# Patient Record
Sex: Male | Born: 2000 | Race: White | Hispanic: No | Marital: Single | Smoking: Never smoker
Health system: Southern US, Community
[De-identification: ages and names within clinical notes are randomized; demographics above are authoritative.]

## PROBLEM LIST (undated history)

## (undated) DIAGNOSIS — F419 Anxiety disorder, unspecified: Secondary | ICD-10-CM

## (undated) HISTORY — PX: NO PAST SURGERIES: SHX2092

---
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
## 2022-01-20 ENCOUNTER — Other Ambulatory Visit: Payer: Self-pay

## 2022-01-20 ENCOUNTER — Emergency Department (HOSPITAL_COMMUNITY): Payer: Medicaid Other

## 2022-01-20 ENCOUNTER — Emergency Department (HOSPITAL_COMMUNITY)
Admission: EM | Admit: 2022-01-20 | Discharge: 2022-01-21 | Disposition: A | Discharge status: Home / Self Care | Payer: Medicaid Other | Attending: Emergency Medicine | Admitting: Emergency Medicine

## 2022-01-20 DIAGNOSIS — E876 Hypokalemia: Secondary | ICD-10-CM | POA: Diagnosis not present

## 2022-01-20 DIAGNOSIS — F1290 Cannabis use, unspecified, uncomplicated: Secondary | ICD-10-CM

## 2022-01-20 DIAGNOSIS — R61 Generalized hyperhidrosis: Secondary | ICD-10-CM | POA: Diagnosis not present

## 2022-01-20 DIAGNOSIS — S8992XA Unspecified injury of left lower leg, initial encounter: Secondary | ICD-10-CM | POA: Diagnosis present

## 2022-01-20 DIAGNOSIS — Y92410 Unspecified street and highway as the place of occurrence of the external cause: Secondary | ICD-10-CM | POA: Insufficient documentation

## 2022-01-20 DIAGNOSIS — Z23 Encounter for immunization: Secondary | ICD-10-CM | POA: Insufficient documentation

## 2022-01-20 DIAGNOSIS — R443 Hallucinations, unspecified: Secondary | ICD-10-CM | POA: Insufficient documentation

## 2022-01-20 DIAGNOSIS — R000 Tachycardia, unspecified: Secondary | ICD-10-CM | POA: Diagnosis not present

## 2022-01-20 DIAGNOSIS — S81012A Laceration without foreign body, left knee, initial encounter: Secondary | ICD-10-CM | POA: Insufficient documentation

## 2022-01-20 DIAGNOSIS — R451 Restlessness and agitation: Secondary | ICD-10-CM | POA: Insufficient documentation

## 2022-01-20 LAB — COMPREHENSIVE METABOLIC PANEL
ALT: 20 U/L (ref 0–44)
AST: 25 U/L (ref 15–41)
Albumin: 4.4 g/dL (ref 3.5–5.0)
Alkaline Phosphatase: 51 U/L (ref 38–126)
Anion gap: 13 (ref 5–15)
BUN: 18 mg/dL (ref 6–20)
CO2: 21 mmol/L — ABNORMAL LOW (ref 22–32)
Calcium: 9.2 mg/dL (ref 8.9–10.3)
Chloride: 105 mmol/L (ref 98–111)
Creatinine, Ser: 1.3 mg/dL — ABNORMAL HIGH (ref 0.61–1.24)
GFR, Estimated: >60 mL/min (ref >60)
Glucose, Bld: 202 mg/dL — ABNORMAL HIGH (ref 70–99)
Potassium: 2.8 mmol/L — ABNORMAL LOW (ref 3.5–5.1)
Sodium: 139 mmol/L (ref 135–145)
Total Bilirubin: 1.2 mg/dL (ref 0.3–1.2)
Total Protein: 7.1 g/dL (ref 6.5–8.1)

## 2022-01-20 LAB — CBC WITH DIFFERENTIAL/PLATELET
Abs Immature Granulocytes: 0.04 10*3/uL (ref 0.00–0.07)
Basophils Absolute: 0.1 10*3/uL (ref 0.0–0.1)
Basophils Relative: 1 %
Eosinophils Absolute: 0.2 10*3/uL (ref 0.0–0.5)
Eosinophils Relative: 3 %
HCT: 47.3 % (ref 39.0–52.0)
Hemoglobin: 15.6 g/dL (ref 13.0–17.0)
Immature Granulocytes: 0 %
Lymphocytes Relative: 36 %
Lymphs Abs: 3.3 10*3/uL (ref 0.7–4.0)
MCH: 27 pg (ref 26.0–34.0)
MCHC: 33 g/dL (ref 30.0–36.0)
MCV: 82 fL (ref 80.0–100.0)
Monocytes Absolute: 1.1 10*3/uL — ABNORMAL HIGH (ref 0.1–1.0)
Monocytes Relative: 12 %
Neutro Abs: 4.3 10*3/uL (ref 1.7–7.7)
Neutrophils Relative %: 48 %
Platelets: 234 10*3/uL (ref 150–400)
RBC: 5.77 MIL/uL (ref 4.22–5.81)
RDW: 12.9 % (ref 11.5–15.5)
WBC: 9 10*3/uL (ref 4.0–10.5)
nRBC: 0 % (ref 0.0–0.2)

## 2022-01-20 LAB — I-STAT CHEM 8, ED
BUN: 20 mg/dL (ref 6–20)
Calcium, Ion: 1.15 mmol/L (ref 1.15–1.40)
Chloride: 101 mmol/L (ref 98–111)
Creatinine, Ser: 1.1 mg/dL (ref 0.61–1.24)
Glucose, Bld: 205 mg/dL — ABNORMAL HIGH (ref 70–99)
HCT: 48 % (ref 39.0–52.0)
Hemoglobin: 16.3 g/dL (ref 13.0–17.0)
Potassium: 2.8 mmol/L — ABNORMAL LOW (ref 3.5–5.1)
Sodium: 141 mmol/L (ref 135–145)
TCO2: 23 mmol/L (ref 22–32)

## 2022-01-20 MED ORDER — LACTATED RINGERS IV SOLN: INTRAVENOUS | Status: DC

## 2022-01-20 MED ORDER — TETANUS-DIPHTH-ACELL PERTUSSIS 5-2.5-18.5 LF-MCG/0.5 IM SUSY
0.5000 mL | PREFILLED_SYRINGE | Freq: Once | INTRAMUSCULAR | Status: AC
  Administered 2022-01-20: 0.5 mL via INTRAMUSCULAR
  Filled 2022-01-20: qty 0.5

## 2022-01-20 MED ORDER — LIDOCAINE-EPINEPHRINE (PF) 2 %-1:200000 IJ SOLN
INTRAMUSCULAR | Status: AC
  Filled 2022-01-20: qty 20

## 2022-01-20 MED ORDER — POTASSIUM CHLORIDE 10 MEQ/100ML IV SOLN
10.0000 meq | INTRAVENOUS | Status: AC
  Administered 2022-01-21 (×2): 10 meq via INTRAVENOUS
  Filled 2022-01-20 (×2): qty 100

## 2022-01-20 MED ORDER — LORAZEPAM 2 MG/ML IJ SOLN
1.0000 mg | Freq: Once | INTRAMUSCULAR | Status: AC
  Administered 2022-01-20: 1 mg via INTRAVENOUS
  Filled 2022-01-20: qty 1
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
## 2022-01-21 MED ORDER — KETOROLAC TROMETHAMINE 15 MG/ML IJ SOLN
15.0000 mg | Freq: Once | INTRAMUSCULAR | Status: AC
  Administered 2022-01-21: 15 mg via INTRAVENOUS
  Filled 2022-01-21: qty 1

## 2022-01-21 NOTE — ED Notes (Signed)
Pt provided water.  
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
## 2022-01-21 NOTE — Progress Notes (Signed)
   01/20/22 2125  Clinical Encounter Type  Visited With Patient not available;Health care provider  Visit Type ED;Trauma;Initial  Referral From Nurse  Consult/Referral To Chaplain Melvenia Beam)  Recommendations Level 2 Trauma   Responded to Emergency Department; Room 25 for Level 2 Trauma. Patient currently being evaluated and treated by medical staff, therefore patient not seen by Chaplain at this time. No family present at this time.  Chaplain will be available for consultation upon request of patient, family, or staff.  Chaplain Johnpaul Gillentine, M.Min., [phone].

## 2022-01-21 NOTE — Discharge Instructions (Addendum)
Do not let your laceration (cut) get wet for the next 48 hours. After that you may allow soapy water to drain down the wound to clean it. Please do not scrub.  To minimize scarring, you can apply a vaseline based ointment for the next 2 weeks and keep it out of direct sun light. After that, you may apply sunscreen for the next several months. Your staples will need to be removed in 10-14 days.  Return if your wound appears to be infected (see laceration care instructions).

## 2022-01-21 NOTE — ED Notes (Signed)
Wound care performed, dressing applied to left knee. Pt still very lethargic. Attempted to stand but unable to solidly hold his own weight, shaking noted. Pt sat on edge of bed, unsteady and RN assisted pt with use of urinal. Pt assisted back into bed.

## 2022-01-21 NOTE — Progress Notes (Signed)
Orthopedic Tech Progress Note Patient Details:  Benjamin Werner 12/21/2000 [id number]  Patient ID: [name], male   DOB: May 15, 2000, 21 y.o.   MRN: [id number] I attended trauma page. Karolee Stamps 01/21/2022, 12:37 AM

## 2022-01-21 NOTE — ED Provider Notes (Signed)
Laceration repaired by me as below, please see Dr. Maryan Rued & Dr. Wilma Flavin notes for further details of patient care.   Procedures  .Marland KitchenLaceration Repair  Date/Time: 01/21/2022 1:13 AM  Performed by: Amaryllis Dyke, PA-C Authorized by: Amaryllis Dyke, PA-C   Consent:    Consent obtained:  Verbal   Consent given by:  Patient and parent   Risks, benefits, and alternatives were discussed: yes     Risks discussed:  Need for additional repair, nerve damage, infection, pain, poor cosmetic result, poor wound healing, vascular damage, tendon damage and retained foreign body   Alternatives discussed:  No treatment Anesthesia:    Anesthesia method:  Local infiltration   Local anesthetic:  Lidocaine 2% WITH epi Laceration details:    Location:  Leg   Leg location:  L knee   Length (cm):  3   Depth (mm):  5 Exploration:    Hemostasis achieved with:  Direct pressure   Contaminated: fabric strands present- manually removed, cleansed w/ betadine & irrigated w/ sterile water.   Treatment:    Area cleansed with:  Povidone-iodine   Amount of cleaning:  Standard   Irrigation solution:  Sterile water   Irrigation method:  Pressure wash   Visualized foreign bodies/material removed: yes   Skin repair:    Repair method:  Staples   Number of staples:  3 Approximation:    Approximation:  Close Repair type:    Repair type:  Simple Post-procedure details:    Procedure completion:  Tolerated well, no immediate complications         Amaryllis Dyke, PA-C 01/21/22 0115    Fatima Blank, MD 01/21/22 [phone]

## 2022-01-27 ENCOUNTER — Emergency Department (HOSPITAL_COMMUNITY): Payer: Medicaid Other

## 2022-01-27 ENCOUNTER — Emergency Department (HOSPITAL_COMMUNITY)
Admission: EM | Admit: 2022-01-27 | Discharge: 2022-01-28 | Disposition: A | Discharge status: Other Acute Inpatient Hospital | Payer: Medicaid Other | Attending: Emergency Medicine | Admitting: Emergency Medicine

## 2022-01-27 ENCOUNTER — Other Ambulatory Visit: Payer: Self-pay

## 2022-01-27 ENCOUNTER — Ambulatory Visit (HOSPITAL_COMMUNITY)
Admission: RE | Admit: 2022-01-27 | Discharge: 2022-01-27 | Disposition: A | Discharge status: Home / Self Care | Payer: Medicaid Other | Attending: Psychiatry | Admitting: Psychiatry

## 2022-01-27 DIAGNOSIS — Y900 Blood alcohol level of less than 20 mg/100 ml: Secondary | ICD-10-CM | POA: Insufficient documentation

## 2022-01-27 DIAGNOSIS — Z20822 Contact with and (suspected) exposure to covid-19: Secondary | ICD-10-CM | POA: Diagnosis not present

## 2022-01-27 DIAGNOSIS — F19959 Other psychoactive substance use, unspecified with psychoactive substance-induced psychotic disorder, unspecified: Secondary | ICD-10-CM

## 2022-01-27 DIAGNOSIS — Z79899 Other long term (current) drug therapy: Secondary | ICD-10-CM | POA: Insufficient documentation

## 2022-01-27 DIAGNOSIS — Z008 Encounter for other general examination: Secondary | ICD-10-CM

## 2022-01-27 DIAGNOSIS — Z046 Encounter for general psychiatric examination, requested by authority: Secondary | ICD-10-CM | POA: Diagnosis present

## 2022-01-27 LAB — RAPID URINE DRUG SCREEN, HOSP PERFORMED
Amphetamines: NOT DETECTED
Barbiturates: NOT DETECTED
Benzodiazepines: NOT DETECTED
Cocaine: NOT DETECTED
Opiates: NOT DETECTED
Tetrahydrocannabinol: NOT DETECTED

## 2022-01-27 LAB — CBC WITH DIFFERENTIAL/PLATELET
Abs Immature Granulocytes: 0.05 10*3/uL (ref 0.00–0.07)
Basophils Absolute: 0 10*3/uL (ref 0.0–0.1)
Basophils Relative: 0 %
Eosinophils Absolute: 0 10*3/uL (ref 0.0–0.5)
Eosinophils Relative: 0 %
HCT: 44.2 % (ref 39.0–52.0)
Hemoglobin: 15 g/dL (ref 13.0–17.0)
Immature Granulocytes: 1 %
Lymphocytes Relative: 9 %
Lymphs Abs: 1 10*3/uL (ref 0.7–4.0)
MCH: 27.4 pg (ref 26.0–34.0)
MCHC: 33.9 g/dL (ref 30.0–36.0)
MCV: 80.7 fL (ref 80.0–100.0)
Monocytes Absolute: 1.5 10*3/uL — ABNORMAL HIGH (ref 0.1–1.0)
Monocytes Relative: 14 %
Neutro Abs: 8.1 10*3/uL — ABNORMAL HIGH (ref 1.7–7.7)
Neutrophils Relative %: 76 %
Platelets: 187 10*3/uL (ref 150–400)
RBC: 5.48 MIL/uL (ref 4.22–5.81)
RDW: 12.9 % (ref 11.5–15.5)
WBC: 10.7 10*3/uL — ABNORMAL HIGH (ref 4.0–10.5)
nRBC: 0 % (ref 0.0–0.2)

## 2022-01-27 LAB — COMPREHENSIVE METABOLIC PANEL
ALT: 16 U/L (ref 0–44)
AST: 22 U/L (ref 15–41)
Albumin: 4.3 g/dL (ref 3.5–5.0)
Alkaline Phosphatase: 48 U/L (ref 38–126)
Anion gap: 8 (ref 5–15)
BUN: 14 mg/dL (ref 6–20)
CO2: 24 mmol/L (ref 22–32)
Calcium: 9.3 mg/dL (ref 8.9–10.3)
Chloride: 104 mmol/L (ref 98–111)
Creatinine, Ser: 1.23 mg/dL (ref 0.61–1.24)
GFR, Estimated: >60 mL/min (ref >60)
Glucose, Bld: 138 mg/dL — ABNORMAL HIGH (ref 70–99)
Potassium: 3.9 mmol/L (ref 3.5–5.1)
Sodium: 136 mmol/L (ref 135–145)
Total Bilirubin: 1.6 mg/dL — ABNORMAL HIGH (ref 0.3–1.2)
Total Protein: 7.3 g/dL (ref 6.5–8.1)

## 2022-01-27 LAB — RESP PANEL BY RT-PCR (FLU A&B, COVID) ARPGX2
Influenza A by PCR: NEGATIVE
Influenza B by PCR: NEGATIVE
SARS Coronavirus 2 by RT PCR: NEGATIVE

## 2022-01-27 LAB — TSH: TSH: 0.498 u[IU]/mL (ref 0.350–4.500)

## 2022-01-27 LAB — ETHANOL: Alcohol, Ethyl (B): <10 mg/dL (ref <10)

## 2022-01-27 MED ORDER — HYDROXYZINE HCL 25 MG PO TABS
25.0000 mg | ORAL_TABLET | Freq: Once | ORAL | Status: AC
  Administered 2022-01-27: 25 mg via ORAL
  Filled 2022-01-27: qty 1

## 2022-01-27 MED ORDER — ZOLPIDEM TARTRATE 5 MG PO TABS
5.0000 mg | ORAL_TABLET | Freq: Every evening | ORAL | Status: DC | PRN
  Administered 2022-01-27 – 2022-01-28 (×2): 5 mg via ORAL
  Filled 2022-01-27 (×2): qty 1

## 2022-01-27 NOTE — ED Provider Notes (Signed)
Care transferred from [facility], PA-C at time of sign out. See their note for full assessment.   Briefly: Patient is 21 y.o. male who presents to the ED with concerns for worsening confusion. Sent by [facility] for medical evaluation.    Plan: Plan per previous PA-C: pending TTS.  Labs Reviewed  COMPREHENSIVE METABOLIC PANEL - Abnormal; Notable for the following components:      Result Value   Glucose, Bld 138 (*)    Total Bilirubin 1.6 (*)    All other components within normal limits  CBC WITH DIFFERENTIAL/PLATELET - Abnormal; Notable for the following components:   WBC 10.7 (*)    Neutro Abs 8.1 (*)    Monocytes Absolute 1.5 (*)    All other components within normal limits  RESP PANEL BY RT-PCR (FLU A&B, COVID) ARPGX2  ETHANOL  RAPID URINE DRUG SCREEN, HOSP PERFORMED  TSH    Clinical Course as of 01/27/22 2128  Sat Jan 27, 2022  2002 Discussed with patient and family that patient will stay the night for TTS consult. Mother notes that patient doesn't take any daily medications.  [SB]    Clinical Course User Index [SB] Yeiren Whitecotton A, PA-C     Doubt intracranial abnormality, electrolyte abnormality, illicit drugs, ETOH, at this time. Pt to follow up with TTS and dispo as per TTS consult. Discussed this with patient, family at bedside.  Answered all available questions.  Patient and family agreeable at this time for TTS consult.  This chart was dictated using voice recognition software, Dragon. Despite the best efforts of this provider to proofread and correct errors, errors may still occur which can change documentation meaning.   Etoile Looman A, PA-C 01/27/22 2132    Regan Lemming, MD 01/27/22 2255

## 2022-01-27 NOTE — ED Notes (Signed)
Abbott Pao (Mom) [phone] or [phone]  Bonney Aid (sister) [phone]

## 2022-01-27 NOTE — ED Triage Notes (Signed)
Pt arrives with mom, sent from [facility] for medical clearance. Pt has reportedly been having episodes of confusion, running out of the house, saying things that don't make sense. Pt denies SI/HI.

## 2022-01-27 NOTE — ED Notes (Signed)
Patient transported to CT 
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
## 2022-01-27 NOTE — ED Provider Notes (Signed)
[facility] DEPT Provider Note   CSN: [id number] Arrival date & time: 01/27/22  1524     History  No chief complaint on file.   Benjamin Werner is a 21 y.o. male   Patient presents to the emergency department from behavioral health hospital.  He had gone there this morning voluntarily for worsening confusion due to marijuana ingestion on 10/21.  No significant psychiatric diagnoses.  Was sent to the ER for medical clearance by behavioral health staff as they were concerned for psychoactive substance induced psychosis.  He has not been cleared psychiatrically.  Per mother and history of bedside, they state the patient has not been acting himself since that episode on 10/21.  They state that he is very paranoid and incredibly overprotective of them.  Mother went to the bathroom the other day, and was only in there for about 30 seconds, before the patient broke the door down because he was worried that something happened to her.  He denies suicidal ideation, but mother states that he had made comments earlier in the week about wanting to harm himself with a gun.  Patient denies HI, AVH.  HPI     Home Medications Prior to Admission medications   Medication Sig Start Date End Date Taking? Authorizing Provider  cefUROXime (CEFTIN) 500 MG tablet Take 1 tablet (500 mg total) by mouth 2 (two) times daily with a meal. Patient not taking: Reported on 07/31/2019 06/13/16   Little, Wenda Overland, MD  diclofenac (VOLTAREN) 50 MG EC tablet Take 1 tablet (50 mg total) by mouth 2 (two) times daily. Patient not taking: Reported on 07/31/2019 02/28/16   Robyn Haber, MD  HYDROcodone-acetaminophen (NORCO/VICODIN) 5-325 MG tablet Take 1 tablet by mouth every 6 (six) hours as needed for severe pain. Patient not taking: Reported on 07/31/2019 06/21/16   Jean Rosenthal, NP  ibuprofen (ADVIL,MOTRIN) 800 MG tablet Take 1 tablet (800 mg total) by mouth every 8 (eight) hours as  needed for mild pain or moderate pain. Patient not taking: Reported on 07/31/2019 06/21/16   Jean Rosenthal, NP      Allergies    Penicillins    Review of Systems   Review of Systems  Psychiatric/Behavioral:  Positive for agitation, behavioral problems and sleep disturbance. Negative for self-injury.   All other systems reviewed and are negative.   Physical Exam Updated Vital Signs BP 128/67   Pulse (!) 105   Temp 99.8 F (37.7 C) (Oral)   Resp 16   SpO2 98%  Physical Exam Vitals and nursing note reviewed.  Constitutional:      Appearance: Normal appearance.  HENT:     Head: Normocephalic and atraumatic.  Eyes:     Conjunctiva/sclera: Conjunctivae normal.  Pulmonary:     Effort: Pulmonary effort is normal. No respiratory distress.  Skin:    General: Skin is warm and dry.     Comments: Linear laceration over left knee with 3 staples, well healing  Neurological:     Mental Status: He is alert.  Psychiatric:        Attention and Perception: He does not perceive auditory or visual hallucinations.        Mood and Affect: Mood is depressed.        Behavior: Behavior is cooperative.        Thought Content: Thought content does not include homicidal or suicidal ideation.        Judgment: Judgment is impulsive.     Comments:  Patient cannot give clear history. Reports "I just worry about them" and points towards family members. Slowed speech but clear.     ED Results / Procedures / Treatments   Labs (all labs ordered are listed, but only abnormal results are displayed) Labs Reviewed  COMPREHENSIVE METABOLIC PANEL - Abnormal; Notable for the following components:      Result Value   Glucose, Bld 138 (*)    Total Bilirubin 1.6 (*)    All other components within normal limits  CBC WITH DIFFERENTIAL/PLATELET - Abnormal; Notable for the following components:   WBC 10.7 (*)    Neutro Abs 8.1 (*)    Monocytes Absolute 1.5 (*)    All other components within normal limits   RESP PANEL BY RT-PCR (FLU A&B, COVID) ARPGX2  ETHANOL  RAPID URINE DRUG SCREEN, HOSP PERFORMED  TSH    EKG None  Radiology No results found.  Procedures .Suture Removal  Date/Time: 01/27/2022 5:53 PM  Performed by: Kateri Plummer, PA-C Authorized by: Kateri Plummer, PA-C   Consent:    Consent obtained:  Verbal   Consent given by:  Patient and parent   Risks discussed:  Bleeding, pain and wound separation Universal protocol:    Patient identity confirmed:  Provided demographic data Location:    Location:  Lower extremity   Lower extremity location:  Leg   Leg location:  L lower leg Procedure details:    Wound appearance:  No signs of infection, good wound healing and clean   Number of staples removed:  3 Post-procedure details:    Post-removal:  No dressing applied   Procedure completion:  Tolerated well, no immediate complications     Medications Ordered in ED Medications - No data to display  ED Course/ Medical Decision Making/ A&P                           Medical Decision Making Amount and/or Complexity of Data Reviewed Labs: ordered.  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   Differential diagnosis includes but is not limited to, Drug overdose or withdrawal, hypoxia, hyper/hypoglycemia, encephalopathy, sepsis, DKA/HHS, brain lesion, CVA, seizure, hypothermia, heat stroke, psychiatric  Past Medical History: No documented psychiatric history   Physical Exam: Tachycardic to 105 bpm, but otherwise normal vital signs.  Overall depressed mood.  Cooperative.  Patient cannot give clear history. Reports "I just worry about them" and points towards family members. Slowed speech but clear.  Labs: Medical clearance labs ordered. Results are pending. Will also collect TSH and CT head to rule out other causes of AMS.   Disposition: Patient is otherwise  medically cleared at this time pending medical clearance laboratory evaluation. Patient discussed and care transferred to [facility] PA-C at shift change. Please see his/her note for further details regarding further ED course and disposition. Plan at time of handoff is await medical clearance labs/imaging and contact TTS if medically cleared.   I discussed this case with my attending physician Dr. Darl Householder who cosigned this note including patient's presenting symptoms, physical exam, and planned diagnostics and interventions. Attending physician stated agreement with plan or made changes to plan which were implemented.   Final Clinical Impression(s) / ED Diagnoses Final diagnoses:  None    Rx / DC Orders ED Discharge Orders     None      Portions of this report may have  been transcribed using voice recognition software. Every effort was made to ensure accuracy; however, inadvertent computerized transcription errors may be present.    Kateri Plummer, PA-C 01/27/22 1801    Drenda Freeze, MD 01/27/22 [phone]

## 2022-01-28 DIAGNOSIS — F19959 Other psychoactive substance use, unspecified with psychoactive substance-induced psychotic disorder, unspecified: Secondary | ICD-10-CM

## 2022-01-28 MED ORDER — BACITRACIN ZINC 500 UNIT/GM EX OINT
TOPICAL_OINTMENT | Freq: Every day | CUTANEOUS | Status: DC
  Administered 2022-01-28: 1 via TOPICAL
  Filled 2022-01-28: qty 0.9

## 2022-01-28 MED ORDER — HYDROXYZINE HCL 25 MG PO TABS
25.0000 mg | ORAL_TABLET | Freq: Three times a day (TID) | ORAL | Status: DC | PRN
  Administered 2022-01-28: 25 mg via ORAL
  Filled 2022-01-28: qty 1

## 2022-01-28 MED ORDER — OLANZAPINE 5 MG PO TABS
2.5000 mg | ORAL_TABLET | Freq: Once | ORAL | Status: AC
  Administered 2022-01-28: 2.5 mg via ORAL
  Filled 2022-01-28: qty 1

## 2022-01-28 MED ORDER — ACETAMINOPHEN 500 MG PO TABS
1000.0000 mg | ORAL_TABLET | Freq: Once | ORAL | Status: AC
  Administered 2022-01-28: 1000 mg via ORAL
  Filled 2022-01-28: qty 2

## 2022-01-28 MED ORDER — HYDROXYZINE HCL 25 MG PO TABS: 25.0000 mg | ORAL_TABLET | Freq: Two times a day (BID) | ORAL | Status: DC | PRN

## 2022-01-28 MED ORDER — OLANZAPINE 5 MG PO TABS
2.5000 mg | ORAL_TABLET | Freq: Every day | ORAL | Status: DC
  Administered 2022-01-28: 2.5 mg via ORAL
  Filled 2022-01-28: qty 1

## 2022-01-28 NOTE — ED Notes (Signed)
Patient is being accepted to [facility]. Will fax the voluntary to [phone]. He is going to room 503-1 under the care of Dr. Caswell Corwin. Called reort to report to [phone] to [place]

## 2022-01-28 NOTE — Progress Notes (Signed)
Pt is under review at [facility]. [facility] AC has requested that consent be faxed to [phone].   Care Team notified: Oris Drone, RN, Josephina Gip, RN, Garrison Columbus, FNP, Harlene Ramus, RN  Voluntary Consent is still PENDING at the time of this witting. CSW will follow up at the return of CSW or disposition team.   Benjaman Kindler, MSW, [facility] 01/28/2022 2:43 PM

## 2022-01-28 NOTE — ED Notes (Signed)
Was unable to get in touch with anyone at TTS, spoke with someone at [facility] about the patient and the need to be reevaluated, she states that she will get in touch with a screener and as them to come and see the patient and speak with the family.   SisterMaxcine Ham [phone]
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
## 2022-01-28 NOTE — ED Provider Notes (Addendum)
Emergency Medicine Observation Re-evaluation Note  Benjamin Werner is a 21 y.o. male, seen on rounds today.  Pt initially presented to the ED for complaints of Mental health problems Currently, the patient is resting.  Physical Exam  BP (!) 158/64 (BP Location: Right Arm)   Pulse (!) 102   Temp 98.6 F (37 C) (Oral)   Resp 16   SpO2 94%  Physical Exam General: Sleeping Cardiac: Regular rate Lungs: No stridor normal effort Psych:  Musculoskeletal: Left knee, small wound dehiscence at the site of previous staple wound closure, no erythema or drainage  ED Course / MDM  EKG:   I have reviewed the labs performed to date as well as medications administered while in observation.  Recent changes in the last 24 hours include Medical evaluation yesterday.  Cleared for psychiatric evaluation and treatment.  Plan  Current plan is for psychiatric evaluation. Knee would will heal by secondary intention.  ABx ointment, bandage daily.       Dorie Rank, MD 01/28/22 [phone]

## 2022-01-28 NOTE — ED Notes (Signed)
Marquitta (mother) would like to be notified as to where the patient will be placed.  Phone # [phone]

## 2022-01-29 ENCOUNTER — Emergency Department (HOSPITAL_COMMUNITY): Payer: Medicaid Other

## 2022-01-29 ENCOUNTER — Other Ambulatory Visit: Payer: Self-pay

## 2022-01-29 ENCOUNTER — Encounter (HOSPITAL_COMMUNITY): Payer: Self-pay

## 2022-01-29 ENCOUNTER — Inpatient Hospital Stay (HOSPITAL_COMMUNITY)
Admission: EM | Admit: 2022-01-29 | Discharge: 2022-01-30 | DRG: 876 | Disposition: A | Discharge status: Home / Self Care | Payer: Medicaid Other | Source: Intra-hospital | Attending: Emergency Medicine | Admitting: Emergency Medicine

## 2022-01-29 ENCOUNTER — Encounter (HOSPITAL_COMMUNITY): Payer: Self-pay | Admitting: Nurse Practitioner

## 2022-01-29 ENCOUNTER — Inpatient Hospital Stay (HOSPITAL_COMMUNITY)
Admission: AD | Admit: 2022-01-29 | Discharge status: Absent without leave | Payer: Medicaid Other | Source: Intra-hospital | Admitting: Nurse Practitioner

## 2022-01-29 DIAGNOSIS — S81012A Laceration without foreign body, left knee, initial encounter: Secondary | ICD-10-CM | POA: Diagnosis present

## 2022-01-29 DIAGNOSIS — Z818 Family history of other mental and behavioral disorders: Secondary | ICD-10-CM | POA: Diagnosis not present

## 2022-01-29 DIAGNOSIS — R45850 Homicidal ideations: Secondary | ICD-10-CM | POA: Diagnosis present

## 2022-01-29 DIAGNOSIS — F23 Brief psychotic disorder: Secondary | ICD-10-CM

## 2022-01-29 DIAGNOSIS — F200 Paranoid schizophrenia: Principal | ICD-10-CM | POA: Diagnosis present

## 2022-01-29 DIAGNOSIS — R45851 Suicidal ideations: Secondary | ICD-10-CM | POA: Diagnosis present

## 2022-01-29 DIAGNOSIS — F32A Depression, unspecified: Secondary | ICD-10-CM | POA: Diagnosis present

## 2022-01-29 DIAGNOSIS — E669 Obesity, unspecified: Secondary | ICD-10-CM | POA: Diagnosis present

## 2022-01-29 DIAGNOSIS — Z9109 Other allergy status, other than to drugs and biological substances: Secondary | ICD-10-CM | POA: Diagnosis not present

## 2022-01-29 DIAGNOSIS — Z91012 Allergy to eggs: Secondary | ICD-10-CM

## 2022-01-29 DIAGNOSIS — X58XXXA Exposure to other specified factors, initial encounter: Secondary | ICD-10-CM | POA: Insufficient documentation

## 2022-01-29 DIAGNOSIS — Z6832 Body mass index (BMI) 32.0-32.9, adult: Secondary | ICD-10-CM | POA: Diagnosis not present

## 2022-01-29 DIAGNOSIS — F411 Generalized anxiety disorder: Secondary | ICD-10-CM | POA: Diagnosis present

## 2022-01-29 DIAGNOSIS — G4700 Insomnia, unspecified: Secondary | ICD-10-CM | POA: Diagnosis present

## 2022-01-29 DIAGNOSIS — F29 Unspecified psychosis not due to a substance or known physiological condition: Secondary | ICD-10-CM | POA: Insufficient documentation

## 2022-01-29 DIAGNOSIS — Z880 Allergy status to penicillin: Secondary | ICD-10-CM | POA: Diagnosis not present

## 2022-01-29 DIAGNOSIS — F19959 Other psychoactive substance use, unspecified with psychoactive substance-induced psychotic disorder, unspecified: Principal | ICD-10-CM

## 2022-01-29 DIAGNOSIS — F1210 Cannabis abuse, uncomplicated: Secondary | ICD-10-CM

## 2022-01-29 DIAGNOSIS — Z9151 Personal history of suicidal behavior: Secondary | ICD-10-CM

## 2022-01-29 DIAGNOSIS — F410 Panic disorder [episodic paroxysmal anxiety] without agoraphobia: Secondary | ICD-10-CM | POA: Diagnosis present

## 2022-01-29 HISTORY — DX: Anxiety disorder, unspecified: F41.9

## 2022-01-29 LAB — HIV ANTIBODY (ROUTINE TESTING W REFLEX): HIV Screen 4th Generation wRfx: NONREACTIVE

## 2022-01-29 LAB — URINALYSIS, ROUTINE W REFLEX MICROSCOPIC
Bilirubin Urine: NEGATIVE
Glucose, UA: NEGATIVE mg/dL
Hgb urine dipstick: NEGATIVE
Ketones, ur: NEGATIVE mg/dL
Leukocytes,Ua: NEGATIVE
Nitrite: NEGATIVE
Protein, ur: NEGATIVE mg/dL
Specific Gravity, Urine: 1.013 (ref 1.005–1.030)
pH: 6 (ref 5.0–8.0)

## 2022-01-29 LAB — VITAMIN B12: Vitamin B-12: 213 pg/mL (ref 180–914)

## 2022-01-29 LAB — FOLATE: Folate: 18.6 ng/mL (ref >5.9)

## 2022-01-29 MED ORDER — DIPHENHYDRAMINE HCL 50 MG/ML IJ SOLN: 25.0000 mg | Freq: Four times a day (QID) | INTRAMUSCULAR | Status: DC | PRN

## 2022-01-29 MED ORDER — HYDROXYZINE HCL 25 MG PO TABS
25.0000 mg | ORAL_TABLET | Freq: Three times a day (TID) | ORAL | Status: DC | PRN
  Administered 2022-01-29 (×2): 25 mg via ORAL
  Filled 2022-01-29 (×2): qty 1

## 2022-01-29 MED ORDER — LORAZEPAM 2 MG/ML IJ SOLN: 2.0000 mg | Freq: Once | INTRAMUSCULAR | Status: DC

## 2022-01-29 MED ORDER — LIDOCAINE-EPINEPHRINE (PF) 2 %-1:200000 IJ SOLN
20.0000 mL | Freq: Once | INTRAMUSCULAR | Status: AC
  Administered 2022-01-29: 20 mL via INTRADERMAL
  Filled 2022-01-29: qty 20

## 2022-01-29 MED ORDER — DIPHENHYDRAMINE HCL 50 MG/ML IJ SOLN
50.0000 mg | Freq: Once | INTRAMUSCULAR | Status: AC
  Administered 2022-01-29: 50 mg via INTRAVENOUS

## 2022-01-29 MED ORDER — OLANZAPINE 5 MG PO TABS
5.0000 mg | ORAL_TABLET | Freq: Every day | ORAL | Status: DC
  Administered 2022-01-29: 5 mg via ORAL
  Filled 2022-01-29 (×3): qty 1

## 2022-01-29 MED ORDER — HALOPERIDOL LACTATE 5 MG/ML IJ SOLN
INTRAMUSCULAR | Status: AC
  Administered 2022-01-29: 5 mg via INTRAMUSCULAR
  Filled 2022-01-29: qty 1

## 2022-01-29 MED ORDER — LORAZEPAM 1 MG PO TABS: 2.0000 mg | ORAL_TABLET | Freq: Four times a day (QID) | ORAL | Status: DC | PRN

## 2022-01-29 MED ORDER — LORAZEPAM 2 MG/ML IJ SOLN
INTRAMUSCULAR | Status: AC
  Filled 2022-01-29: qty 1

## 2022-01-29 MED ORDER — ALUM & MAG HYDROXIDE-SIMETH 200-200-20 MG/5ML PO SUSP: 30.0000 mL | ORAL | Status: DC | PRN

## 2022-01-29 MED ORDER — ZIPRASIDONE HCL 20 MG PO CAPS
40.0000 mg | ORAL_CAPSULE | Freq: Two times a day (BID) | ORAL | Status: DC
  Filled 2022-01-29 (×2): qty 1

## 2022-01-29 MED ORDER — DIPHENHYDRAMINE HCL 50 MG/ML IJ SOLN
25.0000 mg | Freq: Once | INTRAMUSCULAR | Status: DC
  Filled 2022-01-29: qty 0.5

## 2022-01-29 MED ORDER — MAGNESIUM HYDROXIDE 400 MG/5ML PO SUSP: 30.0000 mL | Freq: Every day | ORAL | Status: DC | PRN

## 2022-01-29 MED ORDER — HALOPERIDOL LACTATE 5 MG/ML IJ SOLN: 5.0000 mg | Freq: Once | INTRAMUSCULAR | Status: AC

## 2022-01-29 MED ORDER — OLANZAPINE 5 MG PO TBDP
5.0000 mg | ORAL_TABLET | Freq: Three times a day (TID) | ORAL | Status: DC | PRN
  Filled 2022-01-29: qty 1

## 2022-01-29 MED ORDER — BACITRACIN-NEOMYCIN-POLYMYXIN OINTMENT TUBE
1.0000 | TOPICAL_OINTMENT | Freq: Two times a day (BID) | CUTANEOUS | Status: DC
  Administered 2022-01-29: 1 via TOPICAL
  Filled 2022-01-29: qty 14.17

## 2022-01-29 MED ORDER — ZIPRASIDONE MESYLATE 20 MG IM SOLR: 20.0000 mg | INTRAMUSCULAR | Status: DC | PRN

## 2022-01-29 MED ORDER — ZIPRASIDONE HCL 20 MG PO CAPS
20.0000 mg | ORAL_CAPSULE | Freq: Two times a day (BID) | ORAL | Status: DC
  Administered 2022-01-29: 20 mg via ORAL
  Filled 2022-01-29 (×5): qty 1

## 2022-01-29 MED ORDER — TRAZODONE HCL 50 MG PO TABS
50.0000 mg | ORAL_TABLET | Freq: Once | ORAL | Status: AC
  Administered 2022-01-29: 50 mg via ORAL
  Filled 2022-01-29 (×2): qty 1

## 2022-01-29 MED ORDER — ACETAMINOPHEN 325 MG PO TABS: 650.0000 mg | ORAL_TABLET | Freq: Four times a day (QID) | ORAL | Status: DC | PRN

## 2022-01-29 MED ORDER — LORAZEPAM 1 MG PO TABS: 1.0000 mg | ORAL_TABLET | ORAL | Status: DC | PRN

## 2022-01-29 MED ORDER — DIPHENHYDRAMINE HCL 50 MG/ML IJ SOLN
INTRAMUSCULAR | Status: AC
  Filled 2022-01-29: qty 1

## 2022-01-29 MED ORDER — ZIPRASIDONE MESYLATE 20 MG IM SOLR
INTRAMUSCULAR | Status: AC
  Filled 2022-01-29: qty 20

## 2022-01-29 MED ORDER — CEPHALEXIN 250 MG PO CAPS
500.0000 mg | ORAL_CAPSULE | Freq: Two times a day (BID) | ORAL | Status: DC
  Filled 2022-01-29: qty 2

## 2022-01-29 MED ORDER — HYDROXYZINE HCL 25 MG PO TABS: 25.0000 mg | ORAL_TABLET | Freq: Four times a day (QID) | ORAL | Status: DC | PRN

## 2022-01-29 MED ORDER — LORAZEPAM 2 MG/ML IJ SOLN
2.0000 mg | Freq: Once | INTRAMUSCULAR | Status: DC
  Filled 2022-01-29: qty 1

## 2022-01-29 MED ORDER — TRAZODONE HCL 100 MG PO TABS
50.0000 mg | ORAL_TABLET | Freq: Every evening | ORAL | Status: DC | PRN
  Administered 2022-01-29: 50 mg via ORAL
  Filled 2022-01-29: qty 1

## 2022-01-29 MED ORDER — LORAZEPAM 2 MG/ML IJ SOLN: 2.0000 mg | Freq: Four times a day (QID) | INTRAMUSCULAR | Status: DC | PRN

## 2022-01-29 MED ORDER — DIPHENHYDRAMINE HCL 50 MG/ML IJ SOLN
50.0000 mg | Freq: Once | INTRAMUSCULAR | Status: DC
  Filled 2022-01-29: qty 1

## 2022-01-29 MED ORDER — LIDOCAINE-EPINEPHRINE-TETRACAINE (LET) TOPICAL GEL
3.0000 mL | Freq: Once | TOPICAL | Status: AC
  Administered 2022-01-29: 3 mL via TOPICAL
  Filled 2022-01-29: qty 3

## 2022-01-29 MED ORDER — OLANZAPINE 5 MG PO TBDP: 5.0000 mg | ORAL_TABLET | Freq: Three times a day (TID) | ORAL | Status: DC | PRN

## 2022-01-29 MED ORDER — DIPHENHYDRAMINE HCL 25 MG PO CAPS: 25.0000 mg | ORAL_CAPSULE | Freq: Four times a day (QID) | ORAL | Status: DC | PRN

## 2022-01-29 MED ORDER — ZIPRASIDONE MESYLATE 20 MG IM SOLR
20.0000 mg | INTRAMUSCULAR | Status: AC | PRN
  Administered 2022-01-29: 20 mg via INTRAMUSCULAR
  Filled 2022-01-29: qty 20

## 2022-01-29 MED ORDER — LORAZEPAM 2 MG/ML IJ SOLN
2.0000 mg | Freq: Once | INTRAMUSCULAR | Status: AC
  Administered 2022-01-29: 2 mg via INTRAVENOUS

## 2022-01-29 NOTE — Group Note (Signed)
Recreation Therapy Group Note   Group Topic:Self-Esteem  Group Date: 01/29/2022 Start Time: 1000 End Time: 1660 Facilitators: Jimeka Balan-McCall, LRT,CTRS Location: [address] Dayroom   Goal Area(s) Addresses:  Patient will identify positive stress management techniques. Patient will identify benefits of using stress management post d/c.   Group Description: Meditation.  LRT played a meditation that focused getting renewed for a new day.  It also reinforced the value of positive self talk and keeping a positive attitude.  Patients were to sit comfortable and focus on the meditation as it played to fully engage, relax and clear their minds of any distractions.    Affect/Mood: Appropriate   Participation Level: Active   Participation Quality: Independent   Behavior: Appropriate   Speech/Thought Process: Distracted and Delusional   Insight: Fair   Judgement: Fair    Modes of Intervention: Art and Music   Patient Response to Interventions:  Receptive   Education Outcome:  Acknowledges education and In group clarification offered    Clinical Observations/Individualized Feedback: Pt was appropriate.  Pt asked a lot of questions pertaining to treatment and what is offered here.  Pt even asked if he died here could the hospital be sued.  Pt was soft spoken, engaged in some of the exercises and completed his plate.  Pt expressed he wasn't going to do edibles again.  Pt also expressed a love of playing fighting video games, wanting to feel better and have positive thoughts.  Pt also explained he likes the [employer] and he likes "nutty dates".    Plan: Continue to engage patient in RT group sessions 2-3x/week.   Evangelyne Loja-McCall, LRT,CTRS 01/29/2022 1:04 PM
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
## 2022-01-29 NOTE — Group Note (Signed)
LCSW Group Therapy Note  Group Date: 01/29/2022 Start Time: 1300 End Time: 1400   Type of Therapy and Topic:  Group Therapy - How To Cope with Nervousness about Discharge   Participation Level:  Active   Description of Group This process group involved identification of patients' feelings about discharge. Some of them are scheduled to be discharged soon, while others are new admissions, but each of them was asked to share thoughts and feelings surrounding discharge from the hospital. One common theme was that they are excited at the prospect of going home, while another was that many of them are apprehensive about sharing why they were hospitalized. Patients were given the opportunity to discuss these feelings with their peers in preparation for discharge.  Therapeutic Goals  Patient will identify their overall feelings about pending discharge. Patient will think about how they might proactively address issues that they believe will once again arise once they get home (i.e. with parents). Patients will participate in discussion about having hope for change.   Summary of Patient Progress:  Sidhant was very active throughout the session. He demonstrated good insight into the subject matter, and proved open to input from peers and feedback from [place]. Damaso was respectful of peers and participated throughout the entire session.Patient states that he is ready for discharge and looking forward to getting back to work and being supportive of family.    Therapeutic Modalities Cognitive Behavioral Therapy   Zachery Conch, LCSW 01/29/2022  1:48 PM

## 2022-01-29 NOTE — Progress Notes (Signed)
Recreation Therapy Notes  INPATIENT RECREATION THERAPY ASSESSMENT  Patient Details Name: Benjamin Werner MRN: [id number] DOB: 01/26/2001 Today's Date: 01/29/2022       Information Obtained From: Patient  Able to Participate in Assessment/Interview: Yes  Patient Presentation: Alert  Reason for Admission (Per Patient): Other (Comments), Substance Abuse (Pt stated he injested too much THC in an edible.)  Patient Stressors: Other (Comment) (injury to knees)  Coping Skills:   TV, Music, Exercise, Meditate, Deep Breathing, Talk, Prayer, Avoidance  Leisure Interests (2+):  Games - Video games, Exercise - Jogging, Individual - TV, Individual - Other (Comment) (push-ups, pull-ups, squats)  Frequency of Recreation/Participation: Other (Comment) (Daily)  Awareness of Community Resources:  Yes  Community Resources:  Park, [place], Engineer, drilling  Current Use: Yes  If no, Barriers?:    Expressed Interest in [place]: No  [employer] of Residence:  Investment banker, corporate  Patient Main Form of Transportation: Musician  Patient Strengths:  "mental, physical, spiritual capacity", endurance, work ethic  Patient Identified Areas of Improvement:  working on getting driver's license  Patient Goal for Hospitalization:  "to get better and stop feeling anxious"  Current SI (including self-harm):  No  Current HI:  No  Current AVH: No  Staff Intervention Plan: Group Attendance, Collaborate with Interdisciplinary Treatment Team  Consent to Intern Participation: N/A   Saharra Santo-McCall, LRT,CTRS Abbygael Curtiss A Cortana Vanderford-McCall 01/29/2022, 1:22 PM

## 2022-01-29 NOTE — ED Notes (Signed)
Called transport back to [facility]

## 2022-01-29 NOTE — [facility] Suicide Risk Assessment (Signed)
[facility] Admission Suicide Risk Assessment   Nursing information obtained from:  Patient Demographic factors:  Male, Unemployed Current Mental Status:  NA Loss Factors:  Financial problems / change in socioeconomic status Historical Factors:  Prior suicide attempts Risk Reduction Factors:  Positive social support, Living with another person, especially a relative  Total Time spent with patient: 1.5 hours Principal Problem: Psychoactive substance-induced psychosis ([place]) Diagnosis:  Principal Problem:   Psychoactive substance-induced psychosis ([place]) Active Problems:   Generalized anxiety disorder with panic attacks   Depression, unspecified   Cannabis abuse   Subjective Data: Benjamin Werner is a 21 y.o., male with significant past psychiatric history of unspecified depression who presents to the [facility] from  [facility] Emergency Department for evaluation and management of gross disorganization and bizarre behavior, self reported anxiety and panic attack, and reported suicidal statement in the setting of cannabis use.  Continued Clinical Symptoms:  Alcohol Use Disorder Identification Test Final Score (AUDIT): 1  CLINICAL FACTORS:   More than one psychiatric diagnosis Currently Psychotic Unstable or Poor Therapeutic Relationship  Mental Status Exam: This patient interview was conducted in-person in the presence of the resident physician, attending physician, and medical student. I personally interviewed the patient with members of the team contributing their own questions, and by the end of the interview, established good rapport with the patient.   Basic Cognition: Benjamin Werner is alert; he is oriented to person, place, time, and situation.   Appearance and Grooming: Black male with documented age of 21 y.o. who presents congruently to his documented sex. Patient is seated in a/n upright posture; he appears as documented age, and is casually dressed in  t-shirt and swat pants . Grooming appears overall clean: hair is clean and fingernails appear clean. The patient has no noticeable scent or odor. There are no noticeable scars present present, and there are no visible tattoos. There is no visible evidence of self harm (no cuts / ligature marks / cigarette burns, etc).   Behavior: The patient appears in no acute distress, and during the interview, was calm, focused, required minimal redirection, and behaving appropriately to scenario; he was able to follow commands and compliant to requests and made good eye contact.   The patient did not appear internally or externally preoccupied.   Attitude: Patient's attitude towards the interviewer was cooperative and open.   Motor activity: The patient's movement speed was normal; his gait was normal. There was no notable abnormal facial movements and no notable abnormal extremity movements.   Speech: The patient's speech was clear, fluent, with good articulation, and with appropriately placed inflections. The volume of his speech was normal and normal in quantity. The rate was normal with a normal rhythm. Responses were normal in latency. There were no abnormal patterns in speech.   Mood: "Anxious and worried"   Affect: Patient's affect is anxious with broad range and even fluctuations; his affect is congruent with his stated mood. -------------------------------------------------------------------------------------------------------------------------   Thought Content The patient experiences no hallucinations. The patient describes  paranoid delusions, specifically of unidentifiable threats to him and his family  ; he described no misperceptions of stimuli (illusions). The patient denies feelings of derealization and denies feelings of depersonalization. The patient denies ideas of reference; he denies thought insertion, denies thought withdrawal, denies thought interruption, and denies thought  broadcasting.   Patient at the time of interview denies active suicidal intent and denies passive suicidal ideation; he denies homicidal intent.   Thought Process The  patient's thought process is linear and is goal-directed.   Insight The patient at the time of interview demonstrates fair insight, as evidenced by acknowledgement of substance use disorder/s, lacking understanding of mental health condition/s, inability to identify trigger/s causing mental health decompensation, and inability to identify adaptive and maladaptive coping strategies.   Judgement The patient over the past 24 hours demonstrates fair judgement, as evidenced by help-seeking behavior, such as voluntary admission to mental health facility, engaging inappropriately with staff / other patients, and willingness to start medications .   Expanded Cognitive Exam: A more comprehensive cognitive exam is not indicated at this time.  Sleep  Sleep: Sleep: Poor   Physical Exam: Physical Exam Vitals reviewed.  HENT:     Head: Normocephalic and atraumatic.  Pulmonary:     Effort: Pulmonary effort is normal.  Skin:    General: Skin is warm and dry.  Neurological:     General: No focal deficit present.     Mental Status: He is alert. Mental status is at baseline.    Review of Systems  Constitutional: Negative.   Respiratory: Negative.    Cardiovascular: Negative.   Gastrointestinal: Negative.   Genitourinary: Negative.    Blood pressure (!) 117/97, pulse 90, temperature 98.5 F (36.9 C), temperature source Oral, resp. rate 16, height 5\' 7"  (1.702 m), weight 101.2 kg, SpO2 100 %. Body mass index is 34.93 kg/m.  COGNITIVE FEATURES THAT CONTRIBUTE TO RISK:  None    SUICIDE RISK:  Acute Risk:  Minimal: No identifiable suicidal ideation.  Patients presenting with no risk factors but with morbid ruminations; may be classified as minimal risk based on the severity of the depressive symptoms  PLAN OF CARE: see H&P  for full plan of care  I certify that inpatient services furnished can reasonably be expected to improve the patient's condition.   Camelia Phenes, MD 01/29/2022, 12:48 PM

## 2022-01-29 NOTE — CIRT (Addendum)
STARR code called for this patient at 1700. Patient was being offered oral medications, had been observed to be restless, manic, pacing and increasingly agitated. Patient then pushed past EVS while pushing cart off unit and eloped , running down 300 hall. Patient then ran back to staff , and a STARR code was called as patient was refusing to cooperate with staff when asking to return to unit. Patient then impulsively ran up to EVS cart and grabbed liquid cleaning solution off of the cart and drank it. Staff were attempted to grab it and eventually were able to remove it from patient but writer observed patient drink large gulp and attempted to drink another swallow of cleaning solution. Patient clearly impulsive jumping up and down and running around unit as additional staff support arrived, as patient visibly large and aggressive and swinging at staff. Pt refused to follow verbal redirection, immediate manual hold was placed at 1713- and patient escorted to seclusion room at 1717. CLOSED DOOR SECLUSION AT 2595. Patient kicked staff and was observed attempting to bite staff. During event patient was extremely violent and difficult to control despite staff efforts during event.Emergent medication was given, please refer to [facility]. NP notified as patient continuing to kick staff and fight with staff. Further orders received. Seclusion room closed at 1717 and patient then began to repeatedly bang his head with increased force and would not follow redirection.Pt threatening staff '' I'm going to grab your mother fucking balls!'' [facility] Kelly notified as well as Dr. Caswell Corwin of above .  Patient continues to be violent, refusing to follow staff redirection.  EMS and [employer] called due to patients continued violence and escalation and concern for safety due to cleaning substance. Pt then started to pull on a previous wound on lower left knee, pulling and digging at it to cause bleeding and increased injury.  AT 6387 EMS  arrived. EMT given report by primary RN.  TOTAL TIME FOR SECLUSION IS 5643 TO 1745 at which point patient was escorted onto the stretcher and escorted off unit for medical clearance with [employer] escort. Family contact person on restrictive intervention form was called but went to voicemail, hippa compliant message with primary RN .

## 2022-01-29 NOTE — ED Provider Notes (Signed)
Patient was supposed to come to [facility] from behind.  Received a phone call that he has been there voluntarily for psychosis from marijuana use.  Patient supposedly attempted to drink some toilet bowl cleaner.  Poison control was called by psychiatry team and they recommended a 1 hour observation to look for any caustic injuries in his mouth.  Seems like this was a very small volume if any ingestion.  This was over an hour ago.  I reached out to poison control already anticipating patient to arrive here.  They did confirm this process.  Psychiatry team wanted patient IVC need and medically cleared again before going back to behavioral health.  They have been having issues acutely sedating him and keeping him calm.  Overall patient will need to be medically cleared, stabilized and IVC.  This chart was dictated using voice recognition software.  Despite best efforts to proofread,  errors can occur which can change the documentation meaning.    Lennice Sites, DO 01/29/22 1812

## 2022-01-29 NOTE — H&P (Addendum)
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 therapies   -- Short Term Goals:  Ability to identify changes in lifestyle to reduce recurrence of condition will improve, Ability to verbalize feelings will improve, Ability to demonstrate self-control will improve, Ability to identify and develop effective coping behaviors will improve, Ability to maintain clinical measurements within normal limits will improve, Compliance with prescribed medications will improve, and Ability to identify triggers associated with substance abuse/mental health issues will improve  -- Long Term Goals: Improvement in symptoms so as ready for discharge -- Patient is encouraged to participate in group therapy while admitted to the psychiatric unit. 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  5. Discharge Planning:   -- Social work and case management to assist with discharge planning and identification of hospital follow-up needs prior to discharge  -- Estimated LOS: 5-7 days  -- Discharge Concerns: Need to establish a safety plan; Medication compliance and effectiveness  -- Discharge Goals: Return home with outpatient referrals for mental health follow-up including medication management/psychotherapy  I certify that inpatient services furnished can reasonably be expected to improve the patient's condition.    I discussed my assessment, planned testing and intervention for the patient with Dr. Caswell Corwin who agrees with my formulated course of action.  Camelia Phenes, MD, PGY-1 10/30/202312:18 PM
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
## 2022-01-29 NOTE — ED Notes (Signed)
Spoke with poison control and he is cleared from their stand point.

## 2022-01-29 NOTE — ED Notes (Signed)
IVC PAPERWORK IS COMPLETED

## 2022-01-29 NOTE — ED Notes (Signed)
Report given to [facility] RN 

## 2022-01-29 NOTE — Plan of Care (Signed)
  Problem: Education: Goal: Emotional status will improve Outcome: Progressing Goal: Mental status will improve Outcome: Progressing   Problem: Activity: Goal: Interest or engagement in activities will improve Outcome: Progressing   Problem: Education: Goal: Knowledge of the prescribed therapeutic regimen will improve Outcome: Progressing   Problem: Coping: Goal: Coping ability will improve Outcome: Progressing
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
## 2022-01-29 NOTE — Tx Team (Signed)
Initial Treatment Plan 01/29/2022 1:29 AM Benjamin Werner RJP:[id number]    PATIENT STRESSORS: Financial means    PATIENT STRENGTHS: Supportive family/friends    PATIENT IDENTIFIED PROBLEMS: Panic Attacks  "Not working"                   DISCHARGE CRITERIA:  Improved stabilization in mood, thinking, and/or behavior Reduction of life-threatening or endangering symptoms to within safe limits Verbal commitment to aftercare and medication compliance  PRELIMINARY DISCHARGE PLAN: Return to previous living arrangement Return to previous work or school arrangements  PATIENT/FAMILY INVOLVEMENT: This treatment plan has been presented to and reviewed with the patient, Benjamin Werner.  The patient has been given the opportunity to ask questions and make suggestions.  Azucena Cecil, RN 01/29/2022, 1:29 AM

## 2022-01-29 NOTE — Progress Notes (Signed)
Notified poison control at 1725. Informed them pt drank [employer] Creme Cleanser. Appears more than half of the 32 ounce bottle is full.   Per poison control rinse mouth and skin. Watch for one hour for caustic reaction, I.e. swelling, blistering, drooling, vomiting, and change in breathing.   Benjamin Werner

## 2022-01-29 NOTE — BH IP Treatment Plan (Signed)
Interdisciplinary Treatment and Diagnostic Plan Update  01/29/2022 Time of Session: 10am Benjamin Werner MRN: [id number]  Principal Diagnosis: Psychoactive substance-induced psychosis (HCC)  Secondary Diagnoses: Principal Problem:   Psychoactive substance-induced psychosis (HCC) Active Problems:   Depression, unspecified   Generalized anxiety disorder with panic attacks   Cannabis abuse   Current Medications:  Current Facility-Administered Medications  Medication Dose Route Frequency Provider Last Rate Last Admin   acetaminophen (TYLENOL) tablet 650 mg  650 mg Oral Q6H PRN Bobbitt, Shalon E, NP       alum & mag hydroxide-simeth (MAALOX/MYLANTA) 200-200-20 MG/5ML suspension 30 mL  30 mL Oral Q4H PRN Bobbitt, Shalon E, NP       hydrOXYzine (ATARAX) tablet 25 mg  25 mg Oral Q6H PRN Augusto Gamble, MD       magnesium hydroxide (MILK OF MAGNESIA) suspension 30 mL  30 mL Oral Daily PRN Bobbitt, Shalon E, NP       neomycin-bacitracin-polymyxin (NEOSPORIN) ointment 1 Application  1 Application Topical BID Augusto Gamble, MD   1 Application at 01/29/22 1045   traZODone (DESYREL) tablet 50 mg  50 mg Oral QHS PRN Bobbitt, Shalon E, NP   50 mg at 01/29/22 0117   ziprasidone (GEODON) capsule 20 mg  20 mg Oral BID WC Augusto Gamble, MD   20 mg at 01/29/22 1346   Followed by   Melene Muller ON 01/30/2022] ziprasidone (GEODON) capsule 40 mg  40 mg Oral BID WC Augusto Gamble, MD       PTA Medications: No medications prior to admission.    Patient Stressors: Substance abuse    Patient Strengths: Supportive family/friends   Treatment Modalities: Medication Management, Group therapy, Case management,  1 to 1 session with clinician, Psychoeducation, Recreational therapy.   Physician Treatment Plan for Primary Diagnosis: Psychoactive substance-induced psychosis (HCC) Long Term Goal(s):     Short Term Goals: Ability to identify changes in lifestyle to reduce recurrence of condition will improve Ability to verbalize  feelings will improve Ability to demonstrate self-control will improve Ability to identify and develop effective coping behaviors will improve Ability to maintain clinical measurements within normal limits will improve Compliance with prescribed medications will improve Ability to identify triggers associated with substance abuse/mental health issues will improve  Medication Management: Evaluate patient's response, side effects, and tolerance of medication regimen.  Therapeutic Interventions: 1 to 1 sessions, Unit Group sessions and Medication administration.  Evaluation of Outcomes: Progressing  Physician Treatment Plan for Secondary Diagnosis: Principal Problem:   Psychoactive substance-induced psychosis (HCC) Active Problems:   Depression, unspecified   Generalized anxiety disorder with panic attacks   Cannabis abuse  Long Term Goal(s):     Short Term Goals: Ability to identify changes in lifestyle to reduce recurrence of condition will improve Ability to verbalize feelings will improve Ability to demonstrate self-control will improve Ability to identify and develop effective coping behaviors will improve Ability to maintain clinical measurements within normal limits will improve Compliance with prescribed medications will improve Ability to identify triggers associated with substance abuse/mental health issues will improve     Medication Management: Evaluate patient's response, side effects, and tolerance of medication regimen.  Therapeutic Interventions: 1 to 1 sessions, Unit Group sessions and Medication administration.  Evaluation of Outcomes: Progressing   RN Treatment Plan for Primary Diagnosis: Psychoactive substance-induced psychosis (HCC) Long Term Goal(s): Knowledge of disease and therapeutic regimen to maintain health will improve  Short Term Goals: Ability to remain free from injury will improve, Ability to verbalize frustration  and anger appropriately will  improve, Ability to demonstrate self-control, Ability to participate in decision making will improve, Ability to verbalize feelings will improve, Ability to disclose and discuss suicidal ideas, Ability to identify and develop effective coping behaviors will improve, and Compliance with prescribed medications will improve  Medication Management: RN will administer medications as ordered by provider, will assess and evaluate patient's response and provide education to patient for prescribed medication. RN will report any adverse and/or side effects to prescribing provider.  Therapeutic Interventions: 1 on 1 counseling sessions, Psychoeducation, Medication administration, Evaluate responses to treatment, Monitor vital signs and CBGs as ordered, Perform/monitor CIWA, COWS, AIMS and Fall Risk screenings as ordered, Perform wound care treatments as ordered.  Evaluation of Outcomes: Progressing   LCSW Treatment Plan for Primary Diagnosis: Psychoactive substance-induced psychosis ([place]) Long Term Goal(s): Safe transition to appropriate next level of care at discharge, Engage patient in therapeutic group addressing interpersonal concerns.  Short Term Goals: Engage patient in aftercare planning with referrals and resources, Increase social support, Increase ability to appropriately verbalize feelings, Increase emotional regulation, Facilitate acceptance of mental health diagnosis and concerns, Facilitate patient progression through stages of change regarding substance use diagnoses and concerns, Identify triggers associated with mental health/substance abuse issues, and Increase skills for wellness and recovery  Therapeutic Interventions: Assess for all discharge needs, 1 to 1 time with Social worker, Explore available resources and support systems, Assess for adequacy in community support network, Educate family and significant other(s) on suicide prevention, Complete Psychosocial Assessment, Interpersonal group  therapy.  Evaluation of Outcomes: Progressing   Progress in Treatment: Attending groups: Yes. Participating in groups: Yes. Taking medication as prescribed: Yes. Toleration medication: Yes. Family/Significant other contact made: No, will contact:  CSW will assess and identify social support Patient understands diagnosis: No. Discussing patient identified problems/goals with staff: No. Medical problems stabilized or resolved: Yes. Denies suicidal/homicidal ideation: Yes. Issues/concerns per patient self-inventory: No.   New problem(s) identified: No, Describe:  none reported   New Short Term/Long Term Goal(s): detox, medication management for mood stabilization; elimination of SI thoughts; development of comprehensive mental wellness/sobriety plan    Patient Goals:  Pt states that he would like help with sleep and get meds.    Discharge Plan or Barriers: Patient recently admitted. CSW will continue to follow and assess for appropriate referrals and possible discharge planning.    Reason for Continuation of Hospitalization: Anxiety Delusions  Depression Medication stabilization Withdrawal symptoms  Estimated Length of Stay: 3-7 days  Last 3 [place] Suicide Severity Risk Score: [place] Admission (Current) from 01/29/2022 in [facility] Most recent reading at 01/29/2022  1:00 AM ED from 01/27/2022 in [place] DEPT Most recent reading at 01/27/2022  3:39 PM OP Visit from 01/27/2022 in [place] Most recent reading at 01/27/2022  2:57 PM  C-SSRS RISK CATEGORY No Risk No Risk No Risk       Last PHQ 2/9 Scores:     No data to display          Scribe for Treatment Team: Zachery Conch, LCSW 01/29/2022 3:15 PM
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
## 2022-01-29 NOTE — BHH Counselor (Signed)
B[facility]/BMU LCSW Progress Note   01/29/2022    2:44 PM  Tristin Vandeusen   [id number]   Type of Contact and Topic:  Assessment attempt  CSW attempted to complete CSW assessment.  Patient was sleeping.     Signed:  Riki Altes MSW, LCSW, LCAS 01/29/2022 2:44 PM
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
## 2022-01-29 NOTE — Progress Notes (Signed)
D: Pt denied SI/HI/AVH this morning. Pt rated his depression a 0/10, anxiety a 5/10, and feelings of hopelessness a 0/10. Pt reports mild pain and bleeding from left knee laceration, provider notified. Pt very restless and fidgety throughout the shift. Pt seen pacing the hall throughout shift. Pt has been very needy and requests may things from RNs throughout the day. Pt requested sister's phone number this morning. Pt seen on telephone talking with mother and sister throughout the day.   A: RN irrigated left knee laceration and applied prescribed ointment and applied a dressing to cover wound. PRN hydroxyzine given for anxiety. RN received urine sample from pt. RN provided support and encouragement to patient. RN provided pt with sister's telephone number. Pt given scheduled medications as prescribed. Q15 min checks verified for safety.    R: Patient verbally contracts for safety. Patient compliant with medications and treatment plan. Patient is interacting well on the unit and participating in groups. Pt is safe on the unit.   01/29/22 1020  Psych Admission Type (Psych Patients Only)  Admission Status Voluntary  Psychosocial Assessment  Patient Complaints Anxiety  Eye Contact Fair  Facial Expression Anxious  Affect Anxious  Speech Logical/coherent  Interaction Assertive;Needy  Motor Activity Fidgety;Pacing  Appearance/Hygiene Unremarkable  Behavior Characteristics Anxious;Fidgety  Mood Anxious  Thought Process  Coherency Blocking  Content WDL  Delusions None reported or observed  Perception WDL  Hallucination Auditory (pt appears to be responding to internal stimuli)  Judgment Poor  Confusion None  Danger to Self  Current suicidal ideation? Denies  Danger to Others  Danger to Others None reported or observed

## 2022-01-30 ENCOUNTER — Other Ambulatory Visit: Payer: Self-pay

## 2022-01-30 ENCOUNTER — Encounter (HOSPITAL_COMMUNITY): Payer: Self-pay | Admitting: Psychiatry

## 2022-01-30 ENCOUNTER — Inpatient Hospital Stay (HOSPITAL_COMMUNITY)
Admission: AD | Admit: 2022-01-30 | Discharge: 2022-02-08 | Disposition: A | Discharge status: Home / Self Care | Payer: Medicaid Other | Source: Home / Self Care | Attending: Psychiatry | Admitting: Psychiatry

## 2022-01-30 DIAGNOSIS — G4700 Insomnia, unspecified: Secondary | ICD-10-CM | POA: Diagnosis present

## 2022-01-30 DIAGNOSIS — F29 Unspecified psychosis not due to a substance or known physiological condition: Secondary | ICD-10-CM

## 2022-01-30 DIAGNOSIS — S81012A Laceration without foreign body, left knee, initial encounter: Secondary | ICD-10-CM | POA: Insufficient documentation

## 2022-01-30 DIAGNOSIS — F23 Brief psychotic disorder: Secondary | ICD-10-CM

## 2022-01-30 DIAGNOSIS — F411 Generalized anxiety disorder: Secondary | ICD-10-CM | POA: Diagnosis present

## 2022-01-30 DIAGNOSIS — F200 Paranoid schizophrenia: Principal | ICD-10-CM | POA: Insufficient documentation

## 2022-01-30 LAB — RPR: RPR Ser Ql: NONREACTIVE

## 2022-01-30 MED ORDER — HALOPERIDOL 5 MG PO TABS
10.0000 mg | ORAL_TABLET | Freq: Two times a day (BID) | ORAL | Status: DC
  Administered 2022-01-30 – 2022-02-05 (×12): 10 mg via ORAL
  Filled 2022-01-30 (×16): qty 2

## 2022-01-30 MED ORDER — HALOPERIDOL 5 MG PO TABS
5.0000 mg | ORAL_TABLET | Freq: Every day | ORAL | Status: AC
  Administered 2022-01-30: 5 mg via ORAL
  Filled 2022-01-30: qty 1

## 2022-01-30 MED ORDER — HALOPERIDOL LACTATE 5 MG/ML IJ SOLN: 5.0000 mg | Freq: Three times a day (TID) | INTRAMUSCULAR | Status: DC | PRN

## 2022-01-30 MED ORDER — ACETAMINOPHEN 325 MG PO TABS
650.0000 mg | ORAL_TABLET | Freq: Four times a day (QID) | ORAL | Status: DC | PRN
  Administered 2022-01-30 – 2022-02-04 (×7): 650 mg via ORAL
  Filled 2022-01-30 (×8): qty 2

## 2022-01-30 MED ORDER — HALOPERIDOL 5 MG PO TABS
5.0000 mg | ORAL_TABLET | Freq: Two times a day (BID) | ORAL | Status: DC
  Administered 2022-01-30: 5 mg via ORAL
  Filled 2022-01-30 (×6): qty 1

## 2022-01-30 MED ORDER — LORAZEPAM 2 MG/ML IJ SOLN: 2.0000 mg | Freq: Three times a day (TID) | INTRAMUSCULAR | Status: DC | PRN

## 2022-01-30 MED ORDER — ZIPRASIDONE MESYLATE 20 MG IM SOLR: 20.0000 mg | Freq: Two times a day (BID) | INTRAMUSCULAR | Status: DC | PRN

## 2022-01-30 MED ORDER — OLANZAPINE 5 MG PO TABS: 5.0000 mg | ORAL_TABLET | Freq: Every day | ORAL | Status: DC

## 2022-01-30 MED ORDER — HALOPERIDOL 5 MG PO TABS
5.0000 mg | ORAL_TABLET | Freq: Three times a day (TID) | ORAL | Status: DC | PRN
  Administered 2022-01-30 – 2022-02-04 (×5): 5 mg via ORAL
  Filled 2022-01-30 (×5): qty 1

## 2022-01-30 MED ORDER — BENZTROPINE MESYLATE 0.5 MG PO TABS
0.5000 mg | ORAL_TABLET | Freq: Two times a day (BID) | ORAL | Status: DC
  Administered 2022-01-30: 0.5 mg via ORAL
  Filled 2022-01-30 (×4): qty 1

## 2022-01-30 MED ORDER — MAGNESIUM HYDROXIDE 400 MG/5ML PO SUSP: 30.0000 mL | Freq: Every day | ORAL | Status: DC | PRN

## 2022-01-30 MED ORDER — DIPHENHYDRAMINE HCL 25 MG PO CAPS
50.0000 mg | ORAL_CAPSULE | Freq: Three times a day (TID) | ORAL | Status: DC | PRN
  Administered 2022-01-30 – 2022-02-04 (×7): 50 mg via ORAL
  Filled 2022-01-30 (×8): qty 2

## 2022-01-30 MED ORDER — HYDROXYZINE HCL 25 MG PO TABS
25.0000 mg | ORAL_TABLET | Freq: Three times a day (TID) | ORAL | Status: DC | PRN
  Administered 2022-01-30 – 2022-02-07 (×17): 25 mg via ORAL
  Filled 2022-01-30 (×19): qty 1

## 2022-01-30 MED ORDER — LORAZEPAM 1 MG PO TABS
1.0000 mg | ORAL_TABLET | Freq: Four times a day (QID) | ORAL | Status: DC | PRN
  Administered 2022-01-30 – 2022-02-07 (×7): 1 mg via ORAL
  Filled 2022-01-30 (×6): qty 1

## 2022-01-30 MED ORDER — ALUM & MAG HYDROXIDE-SIMETH 200-200-20 MG/5ML PO SUSP: 30.0000 mL | ORAL | Status: DC | PRN

## 2022-01-30 MED ORDER — DIPHENHYDRAMINE HCL 50 MG/ML IJ SOLN: 50.0000 mg | Freq: Three times a day (TID) | INTRAMUSCULAR | Status: DC | PRN

## 2022-01-30 MED ORDER — BENZTROPINE MESYLATE 1 MG/ML IJ SOLN: 2.0000 mg | Freq: Two times a day (BID) | INTRAMUSCULAR | Status: DC | PRN

## 2022-01-30 MED ORDER — BENZTROPINE MESYLATE 1 MG PO TABS
1.0000 mg | ORAL_TABLET | Freq: Two times a day (BID) | ORAL | Status: DC
  Administered 2022-01-30 – 2022-02-08 (×18): 1 mg via ORAL
  Filled 2022-01-30 (×25): qty 1

## 2022-01-30 MED ORDER — TRAZODONE HCL 50 MG PO TABS
50.0000 mg | ORAL_TABLET | Freq: Every evening | ORAL | Status: DC | PRN
  Administered 2022-01-30 – 2022-02-07 (×8): 50 mg via ORAL
  Filled 2022-01-30 (×8): qty 1

## 2022-01-30 MED ORDER — LORAZEPAM 1 MG PO TABS: 1.0000 mg | ORAL_TABLET | ORAL | Status: DC | PRN

## 2022-01-30 MED ORDER — LORAZEPAM 1 MG PO TABS
2.0000 mg | ORAL_TABLET | Freq: Three times a day (TID) | ORAL | Status: DC | PRN
  Administered 2022-01-31 – 2022-02-04 (×6): 2 mg via ORAL
  Filled 2022-01-30 (×6): qty 2
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
## 2022-01-30 NOTE — Progress Notes (Addendum)
Pt awake in bed, conversing with assigned staff. Denies concerns at this time. Tolerates meals and fluids well. 1:1 observation maintained as ordered with incident thus far.  Received PRN Haldol 5 mg and Benadryl 50 mg both PO at 1604 for agitation, verbal outburst (argumentative when redirected). Pt presents calmer and is more cooperative when reassessed at 1700. Assigned staff in attendance at all times. Continues support, encouragement and reassurance offered to pt this shift.

## 2022-01-30 NOTE — Progress Notes (Signed)
Writer spoke with pt this morning and explained the process moving forward and pt appeared to understand. Pt stated he was scared and paranoid yesterday because he was fearful of what we might do to him in the hospital. Writer reassured pt that he was safe in the hospital and he could come to staff if he started feeling paranoid.

## 2022-01-30 NOTE — BHH Counselor (Signed)
Adult Comprehensive Assessment  Patient ID: Benjamin Werner, male   DOB: 01/26/2001, 21 y.o.   MRN: [id number]  Information Source: Information source: Patient  Current Stressors:  Patient states their primary concerns and needs for treatment are:: Patient states that "I want to be okay" and I need help with sleeping Patient states their goals for this hospitilization and ongoing recovery are:: Patient states thaty he would like to be able to provide for his family Educational / Learning stressors: No stressors Employment / Job issues: Patient states that he works but was unable to say where.  He has been working for the past few months and reports no issues Family Relationships: Patient reports that his family relationships are good Museum/gallery curator / Lack of resources (include bankruptcy): Patient states that "I am okay" Housing / Lack of housing: Patient states that he lives with his mother and "it is okay" Physical health (include injuries & life threatening diseases): "good" Social relationships: Patient states he has limited support Substance abuse: Patient endorses marijuana use. Bereavement / Loss: no stressors  Living/Environment/Situation:  Living Arrangements: Parent Living conditions (as described by patient or guardian): "okay" Who else lives in the home?: sisters, brother and mom How long has patient lived in current situation?: "my whole life" What is atmosphere in current home: Dangerous (patient states paranoia)  Family History:  Marital status: Single Are you sexually active?: No What is your sexual orientation?: heterosexual Has your sexual activity been affected by drugs, alcohol, medication, or emotional stress?: no Does patient have children?: No  Childhood History:  By whom was/is the patient raised?: Mother Additional childhood history information: "I don't know" Description of patient's relationship with caregiver when they were a child: "Should have been around  more" Patient's description of current relationship with people who raised him/her: "okay" How were you disciplined when you got in trouble as a child/adolescent?: unable to assess Does patient have siblings?: Yes Number of Siblings: 5 Description of patient's current relationship with siblings: 40 sisters and 1 brother- okay relationship Did patient suffer any verbal/emotional/physical/sexual abuse as a child?: No Did patient suffer from severe childhood neglect?: No Has patient ever been sexually abused/assaulted/raped as an adolescent or adult?: No Was the patient ever a victim of a crime or a disaster?: No Witnessed domestic violence?: No Has patient been affected by domestic violence as an adult?: No  Education:  Highest grade of school patient has completed: highschool Currently a Ship broker?: No Learning disability?: No  Employment/Work Situation:   Employment Situation: Employed Where is Patient Currently Employed?: patient unable to tell this CSW How Long has Patient Been Employed?: a couple of months Are You Satisfied With Your Job?: Yes Do You Work More Than One Job?: No Work Stressors: none reported Patient's Job has Been Impacted by Current Illness: No What is the Longest Time Patient has Held a Job?: 1 year Where was the Patient Employed at that Time?: patient unable to tell this CSW Has Patient ever Been in the [employer]?: No  Financial Resources:   Financial resources: Income from employment Does patient have a representative payee or guardian?: No  Alcohol/Substance Abuse:   What has been your use of drugs/alcohol within the last 12 months?: marijuana If attempted suicide, did drugs/alcohol play a role in this?: No Alcohol/Substance Abuse Treatment Hx: Denies past history Has alcohol/substance abuse ever caused legal problems?: No  Social Support System:   Patient's Community Support System: Fair Describe Community Support System: family, mother and  brothers/sisters Type of  faith/religion: none How does patient's faith help to cope with current illness?: none  Leisure/Recreation:   Do You Have Hobbies?: Yes Leisure and Hobbies: work out, play games  Strengths/Needs:   What is the patient's perception of their strengths?: patient states that she is kind Patient states they can use these personal strengths during their treatment to contribute to their recovery: yes Patient states these barriers may affect/interfere with their treatment: none Patient states these barriers may affect their return to the community: none Other important information patient would like considered in planning for their treatment: none  Discharge Plan:   Currently receiving community mental health services: No Does patient have access to transportation?: Yes Does patient have financial barriers related to discharge medications?: No Patient description of barriers related to discharge medications: none Will patient be returning to same living situation after discharge?: Yes  Summary/Recommendations:   Summary and Recommendations (to be completed by the evaluator): Benjamin Werner is a 21 year old male who was admitted to [facility] for increased paranoia and confusion after marijuana ingestion.  At time of assessment, patient continued to remain paranoid about signing consents and participating in assessment.  He reports concerns of needing to be discharged so that he can support his family.  Patient also reports issues with sleep.  Patient reports that he lives with mother, brother and sisters.  He is employed and has no stressors with his work. Patient is not connected to outpatient mental health follow up. While here, Benjamin Werner can benefit from crisis stabilization, medication management, therapeutic milieu, and referrals for services.   Benjamin Werner E Neco Kling. 01/30/2022
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
## 2022-01-30 NOTE — Progress Notes (Signed)
Patient A&Ox4. Patient readmitted back to [facility] from [facility] ED. Patient's skin assessment completed . patient has 10 staples covered on his left knee. Per report patient had head CT no issue was noted. Patient denies SI/HI and AVH. Patient denies any physical complaints when asked. No acute distress noted. Support and encouragement provided. Patient placed on 1:1 observation for safety. Encouraged patient to notify staff if thoughts of harm toward self or others arise. Patient verbalize understanding and agreement. Will continue to monitor for safety.

## 2022-01-30 NOTE — Tx Team (Signed)
Initial Treatment Plan 01/30/2022 5:56 AM [name] MWN:[id number]    PATIENT STRESSORS: Medication change or noncompliance   Traumatic event     PATIENT STRENGTHS: Ability for insight  Communication skills  Supportive family/friends    PATIENT IDENTIFIED PROBLEMS: Depression   PTSD  Anxiety                 DISCHARGE CRITERIA:  Ability to meet basic life and health needs Need for constant or close observation no longer present Safe-care adequate arrangements made Verbal commitment to aftercare and medication compliance  PRELIMINARY DISCHARGE PLAN: Participate in family therapy Return to previous work or school arrangements  PATIENT/FAMILY INVOLVEMENT: This treatment plan has been presented to and reviewed with the patient, Benjamin Werner, The patient has been given the opportunity to ask questions and make suggestions.  Einar Pheasant, RN 01/30/2022, 5:56 AM

## 2022-01-30 NOTE — Progress Notes (Signed)
Pt asleep in bed, respirations noted and unlabored. 1:1 observation maintained with assigned staff in attendance. Pt received PRN Ativan 1 mg PO at 1012 and scheduled 1400 Haldol 5 mg given early as well per MD's order due to escalating anxiety / restlessness with desired effect when reassessed at 1110. Pt tolerated lunch and fluids well. Redirectable at this time.

## 2022-01-30 NOTE — Progress Notes (Signed)
[facility] MD Progress Note  01/30/2022 7:06 AM Braiden Rodman  MRN:  [id number]  Principal Problem: Psychosis, unspecified psychosis type (HCC) Diagnosis: Principal Problem:   Psychosis, unspecified psychosis type (HCC)   Reason for Admission:  Hajime Asfaw is a 21 y.o., male with significant past psychiatric history of unspecified depression who presents to the [facility] Voluntary from  [facility] Emergency Department for evaluation and management of gross disorganization and bizarre behavior, self reported anxiety and panic attack, and reported suicidal statement in the setting of cannabis use (admitted on 01/30/2022, total  LOS: 0 days )  Chart Review from last 24 hours:  The patient's chart was reviewed and nursing notes were reviewed. The patient's case was discussed in multidisciplinary team meeting.   Overnight events: STARR activation to 500 hall called, patient was drinking lab cream cleanser. Patient was disorganized, impulsive, jumping up and down and severely agitated, bounced at the door /walls and dug into his incisional wound to the left knee with moderate amount of blood the floor, patient given 20 IM ziprasidone. Transferred to ED and now back to [facility].  - Patient received all scheduled medications - Patient received the following PRN medications: ziprasidone, lorazepam for agitation  Information Obtained Today During Patient Interview: The patient was seen and evaluated on the unit. On assessment today the patient reports that he had a "panic attack" yesterday causing him to behave aggressively.  When asked the patient what he meant by "panic attack," he says he got really scared and was anxious about what we were planning to do to him here in the hospital.  I asked patient why he ate grass when he went outside, and he said "I thought it was appropriate at that time and it would help my mental health."  I also asked the patient about him drinking the chemicals from  the cleaning supply cart, and he responded, "I wanted my panic attack to stop, I thought it was delicious."  Patient asked me several questions, including, what our plan was for him here, how long he would be staying, and whether or not we would be doing the surgery on him.  Explained to the patient that the treatment plan for him here consist of therapy and medication management, but that we will not be doing any surgeries on him.  Patient tells me that "social media" made him think we were going to do surgery on him. He also asked me about his food and that he would like a boiled egg, that he does not like grits.  Patient endorses good sleep; endorses good appetite.  Patient denies any side-effects attributable to medications. Patient denies any somatic complaints  Previous Psych Diagnoses: depression, suicidal thoughts Prior inpatient psychiatric treatment: denies Current/prior outpatient psychiatric treatment: denies Current psychiatrist: denies   Psychiatric medication history: denies   Psychiatric medication compliance history: N/A Neuromodulation history: denies   Current therapist: denies Psychotherapy hx: in high school, spoke to a therapist for depression but cannot recall more details   History of suicide attempts: x1 as a child, drank bleach History of homicide: denies   Last menstrual period (if applicable): N/A Contraceptives: N/A   Substance Use History: Alcohol: occasionally will drink wine, liquor, or beer with mom / friends. Binged once (11 drinks) during his birthday Hx withdrawal tremors/shakes: denies Hx alcohol related blackouts: yes, during his birthday Hx alcohol induced hallucinations: denies Hx alcoholic seizures: denies Hx delirium tremens (DTs): denies DUI: denies -------- Tobacco: denies Marijuana: rarely consumed  edibles Cocaine: denies Methamphetamines: denies MDMA: denies Ecstasy: denies Opiates: denies Benzodiazepines: denies IV drug use:  denies Prescribed Meds abuse: denies   History of Detox / Rehab: denies  Past Medical History:  Past Medical History:  Diagnosis Date   Anxiety    Family History: History reviewed. No pertinent family history. Family History:  Medical: denies Psych: 2 uncles on mom's side have schizophrenia Psych Rx: unknown Suicide: unknown Homicide: unknown Substance use family hx: father is alcoholic   Social History:  Place of birth and grew up where: [place], [place]. Had fallout with dad and moved to [place] to live with mom - has been here approximately 2 years Abuse: denies emotional, physical, or sexual abuse Marital Status: single Sexual orientation: straight Children: denies Employment: Microbiologist Education: high school Housing: lives in house with mother, stepfather, brother, and 3 sisters Finances: employed Armed forces operational officer: denies Hotel manager: denies Weapons: denies Pills stockpile: denies  Current Medications: Current Facility-Administered Medications  Medication Dose Route Frequency Provider Last Rate Last Admin   acetaminophen (TYLENOL) tablet 650 mg  650 mg Oral Q6H PRN Onuoha, Chinwendu V, NP   650 mg at 01/30/22 0657   alum & mag hydroxide-simeth (MAALOX/MYLANTA) 200-200-20 MG/5ML suspension 30 mL  30 mL Oral Q4H PRN Onuoha, Chinwendu V, NP       benztropine (COGENTIN) tablet 0.5 mg  0.5 mg Oral Q12H Massengill, Nathan, MD   0.5 mg at 01/30/22 0700   haloperidol (HALDOL) tablet 5 mg  5 mg Oral Q12H Massengill, Nathan, MD   5 mg at 01/30/22 0700   hydrOXYzine (ATARAX) tablet 25 mg  25 mg Oral TID PRN Onuoha, Chinwendu V, NP   25 mg at 01/30/22 9381   ziprasidone (GEODON) injection 20 mg  20 mg Intramuscular Q12H PRN Onuoha, Chinwendu V, NP       And   LORazepam (ATIVAN) tablet 1 mg  1 mg Oral PRN Onuoha, Chinwendu V, NP       magnesium hydroxide (MILK OF MAGNESIA) suspension 30 mL  30 mL Oral Daily PRN Onuoha, Chinwendu V, NP        Lab Results:  Results for  orders placed or performed during the hospital encounter of 01/29/22 (from the past 48 hour(s))  Vitamin B12     Status: None   Collection Time: 01/29/22  6:51 PM  Result Value Ref Range   Vitamin B-12 213 180 - 914 pg/mL    Comment: (NOTE) This assay is not validated for testing neonatal or myeloproliferative syndrome specimens for Vitamin B12 levels. Performed at [facility], [address] [place], [place] [address]   Folate, serum, performed at [facility] lab     Status: None   Collection Time: 01/29/22  6:51 PM  Result Value Ref Range   Folate 18.6 >5.9 ng/mL    Comment: Performed at [facility] [place] [facility], [address] [place], [place] [address]  HIV Antibody (routine testing w rflx)     Status: None   Collection Time: 01/29/22  6:51 PM  Result Value Ref Range   HIV Screen 4th Generation wRfx Non Reactive Non Reactive    Comment: Performed at [facility], [address], [place], [place] [address]    Blood Alcohol level:  Lab Results  Component Value Date   ETH <10 01/27/2022   ETH <10 07/31/2019    Metabolic Labs: No results found for: "HGBA1C", "MPG" No results found for: "PROLACTIN" No results found for: "CHOL", "  TRIG", "HDL", "CHOLHDL", "VLDL", "LDLCALC"  Sleep:No data recorded  Physical Findings: AIMS: No  CIWA:    COWS:     Mental Status Exam:  Appearance and Grooming: Patient is casually dressed in t-shirt and shorts . The patient has no noticeable scent or odor.  Behavior: The patient appears in no acute distress, and during the interview, was calm, focused, required minimal redirection, and behaving appropriately to scenario; he was compliant to requests and made good eye contact.  The patient did not appear internally or externally preoccupied.  Attitude: Patient's attitude towards the interviewer was cooperative, irritable, as evidenced by his tone of voice, and suspicious, as evidenced by frequently asking what we were planning to  do with him, asking if we were going to do surgeries on him .  Motor activity: The patient's movement speed was normal; his gait was normal. There was no notable abnormal facial movements and no notable abnormal extremity movements.  Speech: The patient's speech was clear, fluent, with good articulation, and with appropriately placed inflections. The volume of his speech was normal and normal in quantity. The rate was normal with a normal rhythm. Responses were normal in latency. There were no abnormal patterns in speech.  Mood: "I'm alright"  Affect: Patient's affect is euthymic with broad range and even fluctuations; his affect is congruent with his stated mood. -------------------------------------------------------------------------------------------------------------------------  Thought Content The patient experiences no hallucinations. The patient describes overvalued ideas, namely that he was going to be treated poorly here at [facility], but was able to acknowledge the possibility that the belief is false; he denies thought insertion, denies thought withdrawal, denies thought interruption, and denies thought broadcasting.  Patient at the time of interview denies active suicidal intent and denies passive suicidal ideation; he denies homicidal intent.  Thought Process The patient's thought process is linear and is goal-directed.  Insight The patient at the time of interview demonstrates poor insight, as evidenced by acknowledgement of substance use disorder/s, lacking understanding of mental health condition/s, inability to identify trigger/s causing mental health decompensation, and inability to identify adaptive and maladaptive coping strategies.  Judgement The patient over the past 24 hours demonstrates poor judgement, as evidenced by unwillingness to voluntarily seek help / treatment, not adhering to medication regimen, and behaving violently towards staff and engaging in self-harm  behaviors, such as drinking chemical cleaner .  Physical Exam Vitals and nursing note reviewed.  Constitutional:      Appearance: Normal appearance.  HENT:     Head: Normocephalic and atraumatic.  Pulmonary:     Effort: Pulmonary effort is normal.  Skin:    Comments: L knee laceration, bandaged  Neurological:     General: No focal deficit present.     Mental Status: He is alert. Mental status is at baseline.    Review of Systems  Constitutional: Negative.   HENT:  Negative for sore throat.   Respiratory: Negative.  Negative for stridor.   Cardiovascular: Negative.   Gastrointestinal: Negative.  Negative for abdominal pain, diarrhea, nausea and vomiting.  Genitourinary: Negative.     Blood pressure 102/65, pulse 72, temperature 98.7 F (37.1 C), temperature source Oral, resp. rate 18, height 5\' 11"  (1.803 m), weight 104.3 kg, SpO2 100 %. Body mass index is 32.08 kg/m.  Assets  Assets: ; Desire for Improvement; Housing; Physical Health   Treatment Plan Summary: Daily contact with patient to assess and evaluate symptoms and progress in treatment and Medication management  Diagnoses / Active Problems: Psychosis, unspecified  psychosis type ([place]) Principal Problem:   Psychosis, unspecified psychosis type ([place])   PLAN: Safety and Monitoring:  -- Involuntary admission to inpatient psychiatric unit for safety, stabilization and treatment  -- Daily contact with patient to assess and evaluate symptoms and progress in treatment  -- Patient's case to be discussed in multi-disciplinary team meeting  -- Observation Level : q15 minute checks  -- Vital signs:  q12 hours  -- Precautions: suicide, elopement, and assault  -- One-to-one sitter due to aggression, agitation  2. Medications:   -- Increase haloperidol from 5 mg to 10 mg q12 hours for psychosis, agitation  -- Increase benztropine from 0.5 mg to 1 mg q12 hours for EPS prophylaxis while on a first  generation antipsychotic  -- Patient does not need nicotine replacement   -- Start benztropine 2 mg intramuscular BID as needed for tremors, dystonic reactions -- Agitation protocol: as needed oral haloperidol 5 mg, and lorazepam 2 mg, and diphenhydramine 50 mg with backup IM of similar meds  The risks/benefits/side-effects/alternatives to the above medication were discussed in detail with the patient and time was given for questions. The patient consents to medication trial. FDA black box warnings, if present, were discussed.  The patient is agreeable with the medication plan, as above. We will monitor the patient's response to pharmacologic treatment, and adjust medications as necessary.  3. Routine and other pertinent labs:             -- Metabolic profile:  BMI: Body mass index is 32.08 kg/m.  Prolactin: No results found for: "PROLACTIN"  Lipid Panel: No results found for: "CHOL", "TRIG", "HDL", "CHOLHDL", "VLDL", "LDLCALC"  HbgA1c: No results found for: "HGBA1C"  TSH: TSH (uIU/mL)  Date Value  01/27/2022 0.498    EKG monitoring: QTc: 53  First-break psychosis workup negative to date  4. Group Therapy:  -- Encouraged patient to participate in unit milieu and in scheduled group therapies   -- Short Term Goals: Ability to identify changes in lifestyle to reduce recurrence of condition will improve, Ability to verbalize feelings will improve, Ability to demonstrate self-control will improve, Ability to identify and develop effective coping behaviors will improve, Ability to maintain clinical measurements within normal limits will improve, and Ability to identify triggers associated with substance abuse/mental health issues will improve  -- Long Term Goals: Improvement in symptoms so as ready for discharge -- Patient is encouraged to participate in group therapy while admitted to the psychiatric unit. -- We will address other chronic and acute stressors, which contributed to  the patient's Psychosis, unspecified psychosis type ([place]) in order to reduce the risk of self-harm at discharge.  5. Discharge Planning:   -- Social work and case management to assist with discharge planning and identification of hospital follow-up needs prior to discharge  -- Estimated LOS: 5-7 days  -- Discharge Concerns: Need to establish a safety plan; Medication compliance and effectiveness  -- Discharge Goals: Return home with outpatient referrals for mental health follow-up including medication management/psychotherapy  I certify that inpatient services furnished can reasonably be expected to improve the patient's condition.    I discussed my assessment, planned testing and intervention for the patient with Dr. Caswell Corwin who agrees with my formulated course of action.  Camelia Phenes, MD, PGY-1 01/30/2022, 7:06 AM

## 2022-01-30 NOTE — Progress Notes (Signed)
   01/30/22 0700  Sleep  Number of Hours 4

## 2022-01-30 NOTE — [facility] Suicide Risk Assessment (Signed)
[facility] INPATIENT:  Family/Significant Other Suicide Prevention Education  Suicide Prevention Education:  Education Completed; Benjamin Werner (mother) [phone],  (name of family member/significant other) has been identified by the patient as the family member/significant other with whom the patient will be residing, and identified as the person(s) who will aid the patient in the event of a mental health crisis (suicidal ideations/suicide attempt).  With written consent from the patient, the family member/significant other has been provided the following suicide prevention education, prior to the and/or following the discharge of the patient.  CSW spoke with patient mother who reports that patient has no prior psychiatric history prior to about 9 days ago when he tried an edible (for first time). Since then patient has been paranoid and feeling like he is not safe. Mother asked for care team to continue to assure him that he is safe. Mother states that patient can return home once he is stable.  She reports no guns/weapons in the house but agreed to lock up any kitchen knives and cleaners that could potentially be used to hurt himself or others.  Mother reports that patient usually gets more panicked at night and mother has had to supervise him throughout the day because he wanted someone to be with him.  No additional safety concerns at this time.   The suicide prevention education provided includes the following: Suicide risk factors Suicide prevention and interventions National Suicide Hotline telephone number [facility] assessment telephone number [facility] Emergency Assistance [place] and/or Residential Mobile Crisis Unit telephone number  Request made of family/significant other to: Remove weapons (e.g., guns, rifles, knives), all items previously/currently identified as safety concern.   Remove drugs/medications (over-the-counter, prescriptions, illicit drugs),  all items previously/currently identified as a safety concern.  The family member/significant other verbalizes understanding of the suicide prevention education information provided.  The family member/significant other agrees to remove the items of safety concern listed above.  Benjamin Werner 01/30/2022, 3:04 PM

## 2022-01-30 NOTE — Progress Notes (Signed)
Pt mother called requesting update on pt status and information on the process of his inpatient stay. Information given to mother with her reassurance that he is safe at this time. PT mother would like to have a family meeting sometime when the pt is better mentally . Mother stated pt continues to be paranoid and scared about being in the hospital and continues to question his safety.

## 2022-01-30 NOTE — Progress Notes (Signed)
Pt awake at medication window on initial contact. Denies SI, HI, AVH when assessed "no, not right now". Reports having vivid dreams which worsens his anxiety. Reports minimal pain with ambulation "My left leg hurt a little bit when I walk but they gave me Tylenol for that". Observed using walker appropriately in milieu. Presents with blunted affect, irritable mood. Emotional support, reassurance and encouragement to pt. Safety checks maintained at Q 15 minutes intervals without self harm gestures or outburst to note thus far.  Verbal education provided on current treatment regimen and effects monitored. Pt ate 50% of breakfast, tolerates fluids. Pt is verbally redirectable at this time.

## 2022-01-30 NOTE — Group Note (Signed)
Recreation Therapy Group Note   Group Topic:Healthy Decision Making  Group Date: 01/30/2022 Start Time: 1000 End Time: 2500 Facilitators: Sadie Hazelett-McCall, LRT,CTRS Location: [address] Dayroom   Goal Area(s) Addresses:  Patient will effectively work with peer towards shared goal.  Patient will identify factors that guided their decision making.  Patient will pro-socially communicate ideas during group session.    Group Description:  Patients were given a scenario that they were going to be stranded on a deserted [place] for several months before being rescued. Writer tasked them with making a list of 15 things they would choose to bring with them for "survival". The list of items was prioritized most important to least. Each patient would come up with their own list, then work together to create a new list of 15 items while in a group of 3-5 peers. LRT discussed each person's list and how it differed from others. The debrief included discussion of priorities, good decisions versus bad decisions, and how it is important to think before acting so we can make the best decision possible. LRT tied the concept of effective communication among group members to patient's support systems outside of the hospital and its benefit post discharge.   Affect/Mood: Drowsy   Participation Level: Minimal   Participation Quality: Independent   Behavior: Sedated   Speech/Thought Process: Relevant   Insight: Fair   Judgement: Fair    Modes of Intervention: Activity   Patient Response to Interventions:  Receptive   Education Outcome:  Acknowledges education and In group clarification offered    Clinical Observations/Individualized Feedback: Pt came to group late and appeared drowsy/sedated.  Pt was able to make a list of survival tools that consisted of tent, blankets, change of clothes, water filter, canteen, spear, matches, rope and pillow.  Pt was falling asleep throughout group session but  would wake up with prompted.     Plan: Continue to engage patient in RT group sessions 2-3x/week.   Lael Wetherbee-McCall, LRT,CTRS  01/30/2022 12:30 PM

## 2022-01-31 DIAGNOSIS — F23 Brief psychotic disorder: Secondary | ICD-10-CM

## 2022-01-31 LAB — CERULOPLASMIN: Ceruloplasmin: 23.9 mg/dL (ref 16.0–31.0)

## 2022-01-31 MED ORDER — SULFAMETHOXAZOLE-TRIMETHOPRIM 800-160 MG PO TABS
1.0000 | ORAL_TABLET | Freq: Two times a day (BID) | ORAL | Status: AC
  Administered 2022-01-31 – 2022-02-04 (×10): 1 via ORAL
  Filled 2022-01-31 (×10): qty 1

## 2022-01-31 MED ORDER — PRAZOSIN HCL 1 MG PO CAPS
1.0000 mg | ORAL_CAPSULE | Freq: Every day | ORAL | Status: DC
  Administered 2022-01-31: 1 mg via ORAL
  Filled 2022-01-31 (×2): qty 1

## 2022-01-31 NOTE — Progress Notes (Signed)
Nursing 1:1 note D:Pt observed sleeping in bed with eyes closed. RR even and unlabored. No distress noted. A: 1:1 observation continues for safety  R: pt remains safe  

## 2022-01-31 NOTE — Progress Notes (Signed)
P;t visible on the unit much of the evening, pt very paranoid and suspicious , needing reassurance from staff. Pt Bandage changed on L-knee , no signs of infection appears to be healing appropriately     01/31/22 2115  Psych Admission Type (Psych Patients Only)  Admission Status Involuntary  Psychosocial Assessment  Patient Complaints Anxiety;Worrying  Eye Contact Fair  Facial Expression Flat  Affect Anxious;Appropriate to circumstance  Speech Logical/coherent  Interaction Cautious;Guarded  Motor Activity Slow  Appearance/Hygiene Disheveled  Behavior Characteristics Anxious  Mood Suspicious;Preoccupied  Aggressive Behavior  Effect No apparent injury  Thought Process  Coherency Circumstantial  Content WDL  Delusions None reported or observed  Perception WDL  Hallucination None reported or observed  Judgment WDL  Confusion None  Danger to Self  Current suicidal ideation? Denies  Danger to Others  Danger to Others None reported or observed

## 2022-01-31 NOTE — Group Note (Signed)
   Date/Time: 11/1 @ 1pm  Type of Therapy and Topic:  Group Therapy:  triggers  Participation Level:  active  Description of Group:   Recognizing Triggers: Patients defined triggers and discussed the importance of recognizing their personal warning signs. Patients identified their own triggers and how they tend to cope with stressful situations. Patients discussed areas such as people, places, things, and thoughts that rigger certain emotions for them. CSW provided support to patients and discussed safety planning for when these triggers occur. Group participants had opportunities to share openly with the group and participate in a group discussion while providing support and feedback to their peers.  Therapeutic Goals: Patient will identify triggers that are contributing to a problem in their life Patient will identify unwanted behaviors and feelings associated with a trigger.  Patient will share with other group members strategies to confront and avoid triggers so that they may be able to react appropriately to triggers in daily life.    Summary of Patient Progress:  Patient participated appropriately in group.  Patient states that he has been more triggered by being awake and not feeling safe.  Patient states that having a support person around him as well as taking deep breaths have been helpful to assist with triggers.      Therapeutic Modalities:   Cognitive Behavioral Therapy Solution Focused Therapy Motivational Interviewing Family Systems Approach   Porter Moes [place], LCSW, [place] Social Worker  [facility]

## 2022-01-31 NOTE — Progress Notes (Signed)
Pt given HS medications, pt came back to the nursing station that he felt the minipress was giving him "homicidal thoughts" , pt given PRN Ativan and Benadryl per [facility] , will continue to monitor.

## 2022-01-31 NOTE — Progress Notes (Signed)
Pt asleep at this time, unable to attend scheduled orientation group.

## 2022-01-31 NOTE — Group Note (Signed)
Recreation Therapy Group Note   Group Topic:Problem Solving  Group Date: 01/31/2022 Start Time: 1005 End Time: 1038 Facilitators: Juanita Streight-McCall, LRT,CTRS Location: [address] Dayroom   Goal Area(s) Addresses:  Patient will effectively work with peer towards shared goal.  Patient will identify skills used to make activity successful.  Patient will share challenges and verbalize solution-driven approaches used. Patient will identify how skills used during activity can be used to reach post d/c goals.   Group Description: [employer]. Patients were provided the following materials: 5 drinking straws, 5 rubber bands, 5 paper clips, 2 index cards and 2 drinking cups.  Using the provided materials patients were asked to build a launching mechanism to launch a ping pong ball across the room, approximately 10 feet. Patients were divided into teams of 3-5. Instructions required all materials be incorporated into the device, functionality of items left to the peer group's discretion.   Affect/Mood: Appropriate and Flat   Participation Level: Minimal   Participation Quality: Independent   Behavior: Appropriate   Speech/Thought Process: Relevant   Insight: Poor   Judgement: Poor   Modes of Intervention: STEM Activity   Patient Response to Interventions:  Attentive   Education Outcome:  Acknowledges education and In group clarification offered    Clinical Observations/Individualized Feedback: Pt was quiet but appropriate.  Pt attempted to participate but was more observant of peers as they worked.  Pt left early and did not return.    Plan: Continue to engage patient in RT group sessions 2-3x/week.   Pacey Willadsen-McCall, LRT,CTRS 01/31/2022 12:09 PM

## 2022-01-31 NOTE — Progress Notes (Signed)
Pt visible in CSW group this afternoon. Engaged in activities in milieu when prompted without issues. Pt started on Bactrim this shift, first dose given. Pt tolerated lunch and fluids well. Remains on 1:1 observation without outburst thus far. Assigned staff in attendance at all times. Pt continues to need frequent verbal redirections.

## 2022-01-31 NOTE — Progress Notes (Signed)
Pt awake, sitting on bench conversing with assigned 1:1 MHT staff. Observed to be suspicious of new male peer who was transfer to unit. Noted to be restless / fidgety this evening; continuously starring down at peer. Approached writer stated "I'm anxious, I need help, I'm I safe here". PRN Ativan 1 mg and Vistaril 25 mg both PO given at 1654 for increased anxiety / restlessness escorted to his room for dinner. Pt is calmer, verbally redirectable when reassessed at 1750. 1:1 observation maintained with assigned staff in attendance at all times. Continued support and reassurance offered to pt this shift.

## 2022-01-31 NOTE — Progress Notes (Signed)
Pt visible at medication window on initial contact. Denies SI, HI, AVH and depression when assessed. Received PRN Tylenol 650 mg PO at 0840 for left knee pain and reported 5/10 relief when reassessed at 0940. Noted with flat affect, soft, logical speech but is suspicious and paranoid on interaction; demanding to see medication packets before taking med. Pt reported he slept well last night with poor appetite "I did not like the breakfast, it tasted like medicine". Sandwich tray given from cafeteria, he ate 50% of it. Received PRN Vistaril for anxiety 6/10 at 0840 and reported decreased when reassessed at [place]. Pt remains medication compliant. 1:1 observation maintained with assigned staff in attendance at all times. Verbal education provided on current treatment regimen and pt is in agreement. Support, encouragement and reassurance offered to pt. Safety maintained in milieu without outburst thus far.

## 2022-02-01 DIAGNOSIS — S81012A Laceration without foreign body, left knee, initial encounter: Secondary | ICD-10-CM | POA: Insufficient documentation

## 2022-02-01 DIAGNOSIS — F23 Brief psychotic disorder: Secondary | ICD-10-CM | POA: Diagnosis not present

## 2022-02-01 MED ORDER — OLANZAPINE 5 MG PO TABS
5.0000 mg | ORAL_TABLET | Freq: Every day | ORAL | Status: DC
  Administered 2022-02-01: 5 mg via ORAL
  Filled 2022-02-01 (×3): qty 1

## 2022-02-01 MED ORDER — PROPRANOLOL HCL 10 MG PO TABS
10.0000 mg | ORAL_TABLET | Freq: Two times a day (BID) | ORAL | Status: DC
  Administered 2022-02-01 – 2022-02-06 (×11): 10 mg via ORAL
  Filled 2022-02-01 (×15): qty 1

## 2022-02-01 NOTE — Progress Notes (Signed)
Nursing 1:1 Note  D: Pt observed in dayroom during group. No distress noted.  A: 1:1 observation continues for safety R: Pt remains safe

## 2022-02-01 NOTE — Progress Notes (Signed)
Nursing 1:1 note D:Pt observed sitting in bed with eyes open. RR even and unlabored. No distress noted. Pt continues to be paranoid, pt continues to think someone will hurt him if he goes to sleep. Staff continues to reassure pt that he is safe on the unit A: 1:1 observation continues for safety  R: pt remains safe

## 2022-02-01 NOTE — Progress Notes (Signed)
Adult Psychoeducational Group Note  Date:  02/01/2022 Time:  8:42 PM  Group Topic/Focus:  Wrap-Up Group:   The focus of this group is to help patients review their daily goal of treatment and discuss progress on daily workbooks.  Participation Level:  Active  Participation Quality:  Appropriate  Affect:  Appropriate  Cognitive:  Appropriate  Insight: Appropriate  Engagement in Group:  Engaged  Modes of Intervention:  Discussion and Support  Additional Comments:   [name]. Pt rated his day 7/10. Pt has made some progress toward his goal of increased sleep. "I been sleeping good, Im going to take my night meds and get more sleep too". Pt shared that he practices deep breathing, drinking water and enjoying family visits to improve coping. [name]  Benjamin Werner [name] 02/01/2022, 8:42 PM

## 2022-02-01 NOTE — Progress Notes (Signed)
Nursing 1:1 note D:Pt observed sleeping in bed with eyes closed. RR even and unlabored. No distress noted. A: 1:1 observation continues for safety  R: pt remains safe  

## 2022-02-01 NOTE — Progress Notes (Signed)
   02/01/22 2215  Psych Admission Type (Psych Patients Only)  Admission Status Involuntary  Psychosocial Assessment  Patient Complaints Anxiety;Worrying;Restlessness  Eye Contact Fair  Facial Expression Flat  Affect Anxious;Appropriate to circumstance  Speech Logical/coherent  Interaction Cautious;Guarded  Motor Activity Slow  Appearance/Hygiene Disheveled  Behavior Characteristics Anxious;Restless  Mood Suspicious;Preoccupied  Aggressive Behavior  Effect No apparent injury  Thought Process  Coherency Circumstantial  Content WDL  Delusions None reported or observed  Perception WDL  Hallucination None reported or observed  Judgment WDL  Confusion None  Danger to Self  Current suicidal ideation? Denies  Danger to Others  Danger to Others None reported or observed

## 2022-02-01 NOTE — Progress Notes (Signed)
Nursing 1:1 Note  D: Pt observed in dayroom watching T.V. Patient is calm and cooperative. No distress noted. A: 1:1 observation continues for safety R: Pt remains safe

## 2022-02-01 NOTE — Progress Notes (Signed)
   02/01/22 1000  Psych Admission Type (Psych Patients Only)  Admission Status Involuntary  Psychosocial Assessment  Patient Complaints Anxiety;Worrying  Eye Contact Fair  Facial Expression Flat  Affect Anxious;Appropriate to circumstance  Speech Logical/coherent  Interaction Cautious;Guarded  Motor Activity Slow  Appearance/Hygiene Disheveled  Behavior Characteristics Anxious;Impulsive;Restless  Mood Suspicious;Preoccupied  Aggressive Behavior  Effect No apparent injury  Thought Process  Coherency Circumstantial  Content WDL  Delusions None reported or observed  Perception WDL  Hallucination None reported or observed  Judgment WDL  Confusion None  Danger to Self  Current suicidal ideation? Denies  Danger to Others  Danger to Others None reported or observed
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
## 2022-02-01 NOTE — Progress Notes (Signed)
Patient received PRN's for anxiety. Patient is drowsy and is continuing to ask for anxiety medication. Saying he is still hearing voices. This Pryor Curia told patient he could not receive anything at this time due to him being drowsy as it would make him overly sedated. Patient stated that he is trying to go to sleep. This Pryor Curia reminded patient that it is not bedtime and he still needs to stay awake to have his dinner. Patient verbalized understanding and returned to the dayroom.

## 2022-02-01 NOTE — Progress Notes (Signed)
   02/01/22 0515  Sleep  Number of Hours 7

## 2022-02-01 NOTE — Progress Notes (Signed)
Nursing 1:1 Note  D: Patient in the dayroom. Calm and cooperative. No distress noted. A: 1:1 continued for safety.  R: Pt remains safe.

## 2022-02-01 NOTE — Group Note (Signed)
Recreation Therapy Group Note   Group Topic:Coping Skills  Group Date: 02/01/2022 Start Time: 1000 End Time: 1027 Facilitators: Jawanna Dykman-McCall, LRT,CTRS Location: [address] Dayroom   Goal Area(s) Addresses: Patient will define what a coping skill is. Patient will successfully identify positive coping skills they can use post d/c.  Patient will acknowledge benefit(s) of using learned coping skills post d/c.  Group Description:  Coping A to Z. Patient asked to identify what a coping skill is and when they use them. Patients with Probation officer discussed healthy versus unhealthy coping skills. Next patients were given a blank worksheet titled "Coping Skills A-Z". Patients were instructed to come up with at least one positive coping skill per letter of the alphabet, addressing a specific challenge (ex: stress, anger, anxiety, depression, grief, doubt, isolation, self-harm/suicidal thoughts, substance use). Patients were given 15 minutes to brainstorm before ideas were presented to the large group. Patients and LRT debriefed on the importance of coping skill selection based on situation and back-up plans when a skill tried is not effective. At the end of group, patients were given an handout of alphabetized strategies to keep for future reference.   Affect/Mood: Flat   Participation Level: Moderate   Participation Quality: Independent   Behavior: Appropriate   Speech/Thought Process: Barely audible  and Relevant   Insight: Fair   Judgement: Fair    Modes of Intervention: Worksheet   Patient Response to Interventions:  Attentive and Engaged   Education Outcome:  Acknowledges education and In group clarification offered    Clinical Observations/Individualized Feedback: Pt appeared drowsy as group went on.  Pt was attentive in the beginning.  Pt was to identify coping skills for anxiety.  Pt came up with breath, calm down, de-stress and eat before leaving group and going to lay down.     Plan: Continue to engage patient in RT group sessions 2-3x/week.   Benjamin Werner, LRT,CTRS 02/01/2022 11:52 AM

## 2022-02-01 NOTE — Progress Notes (Signed)
Called with mom Abbott Pao from 4:00-4:18pm She states that she talks to him everyday and he also told her that he feels safe with the 1:1. He's telling her that he feels better and can't wait to come home. Mom also mentions that he always has someone with him at home so that's why she thinks he feels that way. Mom has noticed that he seems more tired/sedated. Prior to the cannabis, he exercised, was a health nut. She reports he was not using weed consistently. Uncles have schizophrenia. Discussed current diagnosis and medications. Discussed discharge plan to go home after monitoring on current medication regimen. Discussed that younger sister would be unable to visit per unit protocol. Answered questions.

## 2022-02-02 ENCOUNTER — Encounter (HOSPITAL_COMMUNITY): Payer: Self-pay

## 2022-02-02 DIAGNOSIS — F200 Paranoid schizophrenia: Secondary | ICD-10-CM | POA: Diagnosis not present

## 2022-02-02 DIAGNOSIS — R45851 Suicidal ideations: Secondary | ICD-10-CM | POA: Diagnosis not present

## 2022-02-02 DIAGNOSIS — S81012A Laceration without foreign body, left knee, initial encounter: Secondary | ICD-10-CM | POA: Diagnosis not present

## 2022-02-02 DIAGNOSIS — F23 Brief psychotic disorder: Secondary | ICD-10-CM | POA: Diagnosis not present

## 2022-02-02 DIAGNOSIS — R45850 Homicidal ideations: Secondary | ICD-10-CM | POA: Diagnosis not present

## 2022-02-02 MED ORDER — OLANZAPINE 10 MG PO TABS
10.0000 mg | ORAL_TABLET | Freq: Every day | ORAL | Status: DC
  Administered 2022-02-02: 10 mg via ORAL
  Filled 2022-02-02 (×3): qty 1
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
## 2022-02-02 NOTE — Progress Notes (Signed)
   02/02/22 2115  Psych Admission Type (Psych Patients Only)  Admission Status Involuntary  Psychosocial Assessment  Patient Complaints Anxiety;Worrying  Eye Contact Fair  Facial Expression Flat  Affect Anxious;Appropriate to circumstance  Speech Logical/coherent  Interaction Cautious;Guarded  Motor Activity Slow  Appearance/Hygiene Disheveled  Behavior Characteristics Anxious;Restless  Mood Anxious;Suspicious;Preoccupied  Aggressive Behavior  Effect No apparent injury  Thought Process  Coherency Circumstantial  Content Paranoia  Delusions None reported or observed  Perception WDL  Hallucination None reported or observed  Judgment WDL  Confusion None  Danger to Self  Current suicidal ideation? Denies  Danger to Others  Danger to Others None reported or observed

## 2022-02-02 NOTE — Group Note (Signed)
Therapy Type: Group Therapy  Participation Level:  Did Not Attend   Patients received a worksheet with an outline of 2 gingerbread men with a separation in the middle of the page. One sign designated what the pt sees about themselves and the other is what others see. Pts were asked to introduce themselves and share something they like about themself. Pts were then asked to draw, write or color how they view themselves as well as how they are viewed by others. CSW led discussion about the feelings and words associated with each side.   Patient Summary:    Pt did not attend.   Keasha Malkiewicz, LCSW, LCAS Clincal Social Worker  [facility]   

## 2022-02-02 NOTE — Progress Notes (Signed)
Nursing 1:1 Note D: Patient resting in room. Calm and cooperative. Respirations even and unlabored. No distress noted. A: 1:1 continued for safety R: Patient remains safe

## 2022-02-02 NOTE — Progress Notes (Signed)
Nursing 1:1 Note  D: Patient in his bedroom laying in bed resting. Respirations even and unlabored. No distress noted. A: 1:1 continued for safety. R: Patient remains safe

## 2022-02-02 NOTE — Progress Notes (Signed)
Benjamin Werner requested to meet with chaplain following the spirituality group.  He shared that he doesn't always feel safe here in the hospital because he cannot lock his door.  He requested prayer for his safety, which chaplain provided.  Chaplain also reassured him that staff were always present on the unit in order to keep everyone safe.  [address], [place] Pager, [phone]

## 2022-02-02 NOTE — [facility] Group Notes (Signed)
Adult Psychoeducational Group Note  Date:  02/02/2022 Time:  8:42 PM  Group Topic/Focus:  Coping With Mental Health Crisis:   The purpose of this group is to help patients identify strategies for coping with mental health crisis.  Group discusses possible causes of crisis and ways to manage them effectively.  Participation Level:  Active  Participation Quality:  Attentive  Affect:  Appropriate  Cognitive:  Alert  Insight: Good  Engagement in Group:  Engaged  Modes of Intervention:  Discussion  Additional Comments  Dalene Carrow 02/02/2022, 8:42 PM

## 2022-02-02 NOTE — B[facility] Counselor (Signed)
[facility]/BM[facility] LCSW Progress Note   02/02/2022    3:31 PM  Benjamin Werner   [phone]   Type of Contact and Topic:  work note   Mother called and requested a work note to assist with excusing patient from work while receiving treatment.  CSW drafted note and sent to mother via email.      Signed:  Riki Altes MSW, LCSW, LCAS 02/02/2022 3:31 PM

## 2022-02-02 NOTE — Progress Notes (Signed)
Nursing 1:1 note D:Pt observed sleeping in bed with eyes closed. RR even and unlabored. No distress noted. A: 1:1 observation continues for safety  R: pt remains safe  

## 2022-02-02 NOTE — Progress Notes (Signed)
Nursing 1:1 Note D: Patient in his room resting in bed. Respirations even and unlabored. No distress noted. A: 1:1 continued for safety R: Patient remains safe.

## 2022-02-02 NOTE — Progress Notes (Signed)
   02/02/22 0900  Psych Admission Type (Psych Patients Only)  Admission Status Involuntary  Psychosocial Assessment  Patient Complaints Anxiety;Worrying  Eye Contact Fair  Facial Expression Flat  Affect Anxious;Appropriate to circumstance  Speech Logical/coherent  Interaction Cautious;Guarded  Motor Activity Slow  Appearance/Hygiene Disheveled  Behavior Characteristics Anxious;Restless;Pacing  Mood Anxious;Suspicious;Preoccupied  Aggressive Behavior  Effect No apparent injury  Thought Process  Coherency Circumstantial  Content WDL  Delusions None reported or observed  Perception WDL  Hallucination None reported or observed  Judgment WDL  Confusion None  Danger to Self  Current suicidal ideation? Denies  Danger to Others  Danger to Others None reported or observed

## 2022-02-02 NOTE — Group Note (Unsigned)
Occupational Therapy Group Note  Group Topic:Coping Skills  Group Date: 02/02/2022 Start Time: 1400 End Time: 1445 Facilitators: Isiaah Cuervo G, OT   Group Description: Group encouraged increased engagement and participation through discussion and activity focused on "Coping Ahead." Patients were split up into teams and selected a card from a stack of positive coping strategies. Patients were instructed to act out/charade the coping skill for other peers to guess and receive points for their team. Discussion followed with a focus on identifying additional positive coping strategies and patients shared how they were going to cope ahead over the weekend while continuing hospitalization stay.  Therapeutic Goal(s): Identify positive vs negative coping strategies. Identify coping skills to be used during hospitalization vs coping skills outside of hospital/at home Increase participation in therapeutic group environment and promote engagement in treatment   Participation Level: {OT [facility] Participation Level:26267}   Participation Quality: {OT [facility] Participation Quality:26268}   Behavior: {BHH OT Group Behavior:26269}   Speech/Thought Process: {B[facility] OT Speech/Thought Process:26270}   Affect/Mood: {OT [facility] Affect/Mood:26271}   Insight: {OT [facility] Insight:26272}   Judgement: {OT [facility] Judgement:26272}   Individualization: *** was *** in their participation of group discussion/activity. *** identified  Modes of Intervention: {B[facility] MODES OF INTERVENTION:26273}  Patient Response to Interventions:  {B[facility] OT Patient Response to Interventions:26274}   Plan: Continue to engage patient in OT groups 2 - 3x/week.  02/02/2022  Talaysha Freeberg G Devean Skoczylas, OT   

## 2022-02-02 NOTE — BH IP Treatment Plan (Signed)
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)  Secondary Diagnoses: Principal Problem:   Brief psychotic disorder ([place]) Active Problems:   Laceration of left knee   Current Medications:  Current Facility-Administered Medications  Medication Dose Route Frequency Provider Last Rate Last Admin   acetaminophen (TYLENOL) tablet 650 mg  650 mg Oral Q6H PRN Onuoha, Chinwendu V, NP   650 mg at 02/02/22 0805   alum & mag hydroxide-simeth (MAALOX/MYLANTA) 200-200-20 MG/5ML suspension 30 mL  30 mL Oral Q4H PRN Onuoha, Chinwendu V, NP       benztropine (COGENTIN) tablet 1 mg  1 mg Oral Q12H Massengill, Nathan, MD   1 mg at 02/02/22 0802   benztropine mesylate (COGENTIN) injection 2 mg  2 mg Intramuscular BID PRN Massengill, Ovid Curd, MD       haloperidol (HALDOL) tablet 5 mg  5 mg Oral TID PRN Janine Limbo, MD   5 mg at 02/01/22 5027   And   LORazepam (ATIVAN) tablet 2 mg  2 mg Oral TID PRN Janine Limbo, MD   2 mg at 02/01/22 7412   And   diphenhydrAMINE (BENADRYL) capsule 50 mg  50 mg Oral TID PRN Janine Limbo, MD   50 mg at 02/01/22 0854   haloperidol lactate (HALDOL) injection 5 mg  5 mg Intramuscular TID PRN Massengill, Ovid Curd, MD       And   LORazepam (ATIVAN) injection 2 mg  2 mg Intramuscular TID PRN Massengill, Ovid Curd, MD       And   diphenhydrAMINE (BENADRYL) injection 50 mg  50 mg Intramuscular TID PRN Massengill, Ovid Curd, MD       haloperidol (HALDOL) tablet 10 mg  10 mg Oral Q12H Massengill, Nathan, MD   10 mg at 02/02/22 0802   hydrOXYzine (ATARAX) tablet 25 mg  25 mg Oral TID PRN Onuoha, Chinwendu V, NP   25 mg at 02/02/22 0802   LORazepam (ATIVAN) tablet 1 mg  1 mg Oral Q6H PRN Massengill, Ovid Curd, MD   1 mg at 02/01/22 1557   magnesium hydroxide (MILK OF MAGNESIA) suspension 30 mL  30 mL Oral Daily PRN Onuoha, Chinwendu V, NP       OLANZapine  (ZYPREXA) tablet 5 mg  5 mg Oral QHS Rolanda Lundborg, MD   5 mg at 02/01/22 2027   propranolol (INDERAL) tablet 10 mg  10 mg Oral Q12H Massengill, Ovid Curd, MD   10 mg at 02/02/22 0801   sulfamethoxazole-trimethoprim (BACTRIM DS) 800-160 MG per tablet 1 tablet  1 tablet Oral Q12H Rolanda Lundborg, MD   1 tablet at 02/02/22 0802   traZODone (DESYREL) tablet 50 mg  50 mg Oral QHS PRN Evette Georges, NP   50 mg at 02/01/22 2027   PTA Medications: No medications prior to admission.    Patient Stressors: Medication change or noncompliance   Traumatic event    Patient Strengths: Ability for insight  Communication skills  Supportive family/friends   Treatment Modalities: Medication Management, Group therapy, Case management,  1 to 1 session with clinician, Psychoeducation, Recreational therapy.   Physician Treatment Plan for Primary Diagnosis: Brief psychotic disorder ([place]) Long Term Goal(s):     Short Term Goals: Ability to identify changes in lifestyle to reduce recurrence of condition will improve Ability to verbalize feelings will improve Ability to demonstrate self-control will improve Ability to identify and develop effective coping behaviors will improve Ability to maintain clinical measurements  within normal limits will improve Ability to identify triggers associated with substance abuse/mental health issues will improve  Medication Management: Evaluate patient's response, side effects, and tolerance of medication regimen.  Therapeutic Interventions: 1 to 1 sessions, Unit Group sessions and Medication administration.  Evaluation of Outcomes: Progressing  Physician Treatment Plan for Secondary Diagnosis: Principal Problem:   Brief psychotic disorder ([facility]) Active Problems:   Laceration of left knee  Long Term Goal(s):     Short Term Goals: Ability to identify changes in lifestyle to reduce recurrence of condition will improve Ability to verbalize feelings will  improve Ability to demonstrate self-control will improve Ability to identify and develop effective coping behaviors will improve Ability to maintain clinical measurements within normal limits will improve Ability to identify triggers associated with substance abuse/mental health issues will improve     Medication Management: Evaluate patient's response, side effects, and tolerance of medication regimen.  Therapeutic Interventions: 1 to 1 sessions, Unit Group sessions and Medication administration.  Evaluation of Outcomes: Progressing   RN Treatment Plan for Primary Diagnosis: Brief psychotic disorder ([facility]) Long Term Goal(s): Knowledge of disease and therapeutic regimen to maintain health will improve  Short Term Goals: Ability to remain free from injury will improve, Ability to verbalize frustration and anger appropriately will improve, Ability to demonstrate self-control, Ability to participate in decision making will improve, Ability to verbalize feelings will improve, Ability to disclose and discuss suicidal ideas, Ability to identify and develop effective coping behaviors will improve, and Compliance with prescribed medications will improve  Medication Management: RN will administer medications as ordered by provider, will assess and evaluate patient's response and provide education to patient for prescribed medication. RN will report any adverse and/or side effects to prescribing provider.  Therapeutic Interventions: 1 on 1 counseling sessions, Psychoeducation, Medication administration, Evaluate responses to treatment, Monitor vital signs and CBGs as ordered, Perform/monitor CIWA, COWS, AIMS and Fall Risk screenings as ordered, Perform wound care treatments as ordered.  Evaluation of Outcomes: Progressing   LCSW Treatment Plan for Primary Diagnosis: Brief psychotic disorder [facility]) Long Term Goal(s): Safe transition to appropriate next level of care at discharge, Engage patient in  therapeutic group addressing interpersonal concerns.  Short Term Goals: Engage patient in aftercare planning with referrals and resources, Increase social support, Increase ability to appropriately verbalize feelings, Increase emotional regulation, Facilitate acceptance of mental health diagnosis and concerns, Facilitate patient progression through stages of change regarding substance use diagnoses and concerns, Identify triggers associated with mental health/substance abuse issues, and Increase skills for wellness and recovery  Therapeutic Interventions: Assess for all discharge needs, 1 to 1 time with Social worker, Explore available resources and support systems, Assess for adequacy in community support network, Educate family and significant other(s) on suicide prevention, Complete Psychosocial Assessment, Interpersonal group therapy.  Evaluation of Outcomes: Progressing   Progress in Treatment: Attending groups: Yes. Participating in groups: No Taking medication as prescribed: Yes. Toleration medication: Yes. Family/Significant other contact made: Yes, individual(s) contacted:  [name] (mother) [phone] Patient understands diagnosis: No. Discussing patient identified problems/goals with staff: Yes. Medical problems stabilized or resolved: Yes. Denies suicidal/homicidal ideation: No. Issues/concerns per patient self-inventory: Yes. Other: none  New problem(s) identified: No, Describe:  none  New Short Term/Long Term Goal(s): Patient to work towards elimination of symptoms of psychosis, medication management for mood stabilization; elimination of SI thoughts; development of comprehensive mental wellness plan.  Patient Goals:  No additional goals identified at this time. Patient to continue to work towards original  goals identified in initial treatment team meeting. CSW will remain available to patient should they voice additional treatment goals.   Discharge Plan or  Barriers: No psychosocial barriers identified at this time, patient to return to place of residence when appropriate for discharge.   Reason for Continuation of Hospitalization: Medication stabilization Other; describe Psychosis   Estimated Length of Stay: 1-7 days   Scribe for Treatment Team: Almedia Balls 02/02/2022 9:23 AM

## 2022-02-02 NOTE — BHH Group Notes (Signed)
Spirituality group facilitated by Kathrynn Humble, [place].  Group Description: Group focused on topic of hope. Patients participated in facilitated discussion around topic, connecting with one another around experiences and definitions for hope. Group members engaged with visual explorer photos, reflecting on what hope looks like for them today. Group engaged in discussion around how their definitions of hope are present today in hospital.  Modalities: Psycho-social ed, Adlerian, Narrative, MI  Patient Progress: Benjamin Werner attended group and actively engaged in group conversation.  His comments were on topic and appropriate and showed fair insight into the topic.  [address] [place] Pager, [phone]

## 2022-02-02 NOTE — Group Note (Signed)
Recreation Therapy Group Note   Group Topic:Relaxation  Group Date: 02/02/2022 Start Time: 1005 End Time: 1030 Facilitators: Jeremih Dearmas-McCall, LRT,CTRS Location: [address] Day[address]   Goal Area(s) Addresses:  Patient will identify positive relaxation techniques. Patient will identify benefits of using relaxation post d/c.  Group Description: Music Therapy.  LRT introduced patients to the concept of using music as a tool of relaxation.  Patients were able to choose songs they liked and LRT would play them for the group.  The only stipulation was the songs had to be clean and appropriate for the session.    Affect/Mood: N/A   Participation Level: Did not attend    Clinical Observations/Individualized Feedback:     Plan: Continue to engage patient in RT group sessions 2-3x/week.   Duaa Stelzner-McCall, LRT,CTRS 02/02/2022 12:53 PM

## 2022-02-02 NOTE — Progress Notes (Signed)
Nursing 1:1 note D:Pt observed  in bed talking with staff. RR even and unlabored. No distress noted. A: 1:1 observation continues for safety  R: pt remains safe

## 2022-02-02 NOTE — Progress Notes (Signed)
   02/02/22 0515  Sleep  Number of Hours 5.25

## 2022-02-03 DIAGNOSIS — F23 Brief psychotic disorder: Secondary | ICD-10-CM | POA: Diagnosis not present

## 2022-02-03 MED ORDER — OLANZAPINE 7.5 MG PO TABS
15.0000 mg | ORAL_TABLET | Freq: Every day | ORAL | Status: DC
  Administered 2022-02-03 – 2022-02-07 (×5): 15 mg via ORAL
  Filled 2022-02-03 (×9): qty 2

## 2022-02-03 NOTE — Progress Notes (Signed)
Pt remains on 1:1 with staff. No s/s of discomfort and/or distress noted. Pt remains cooperative.  

## 2022-02-03 NOTE — Progress Notes (Signed)
Pt was informed that his phone privileges have been suspended at this time due to after talking to his father he became upset, more paranoid and was hard to redirect to relaxing and going to sleep. Pt informed that he would have to talk to the doctor to get his phone privileges returned at this time.

## 2022-02-03 NOTE — BHH Group Notes (Signed)
Goals Group 02/03/22   Group Focus: affirmation, clarity of thought, and goals/reality orientation Treatment Modality:  Psychoeducation Interventions utilized were assignment, group exercise, and support Purpose: To be able to understand and verbalize the reason for their admission to the hospital. To understand that the medication helps with their chemical imbalance but they also need to work on their choices in life. To be challenged to develop a list of 30 positives about themselves. Also introduce the concept that "feelings" are not reality.  Participation Level:  Active  Participation Quality:  Appropriate  Affect:  Appropriate  Cognitive:  Appropriate  Insight:  Improving  Engagement in Group:  Engaged  Additional Comments:  Rates energy at a 10/10, Fell asleep in the group, however did do the project of 20 positive's about himself.  Benjamin Werner

## 2022-02-03 NOTE — Progress Notes (Addendum)
Patient notified Writer of additional drainage from wound. Writer requested mental health provider assess woundbed. Mental Health Provider came to Pt bedside and assessed wound. Writer applied clean dressings to wound. Pt remains paranoid and asked, "Am I still safe here?" Writer and mental health provider encouraged Pt to relax. Staples remain intact.

## 2022-02-03 NOTE — Progress Notes (Signed)
Adult Psychoeducational Group Note  Date:  02/03/2022 Time:  9:46 PM  Group Topic/Focus:  Wrap-Up Group:   The focus of this group is to help patients review their daily goal of treatment and discuss progress on daily workbooks.  Participation Level:  Active  Participation Quality:  Appropriate  Affect:  Appropriate  Cognitive:  Appropriate  Insight: Appropriate  Engagement in Group:  Developing/Improving  Modes of Intervention:  Discussion  Additional Comments:  Pt stated his goal for today was to focus on his treatment plan. Pt stated he accomplished his goal today. Pt stated he talked with his doctor and social worker about his care today. Pt rated his overall day a 10. Pt stated he was able to contact his mother and his mother coming for visitation tonight improved his over all day. Pt stated he felt better about himself today. Pt stated staff brought back all his meals today because he is on a 1:1. Pt stated he took all medications provided today. Pt stated he attend all groups held today. Pt stated his appetite was pretty good today. Pt rated sleep last night was pretty good. Pt stated the goal tonight was to get some rest. Pt stated he had no physical pain tonight. Pt deny visual hallucinations and auditory issues tonight. Pt denies thoughts of harming himself or others. Pt stated he would alert staff if anything changed.  Candy Sledge 02/03/2022, 9:46 PM

## 2022-02-03 NOTE — Progress Notes (Signed)
Pt is A&OX4, calm, paranoid, denies suicidal ideations, denies homicidal ideations, denies auditory hallucinations and denies visual hallucinations. Pt verbally agrees to approach staff if these become apparent and before harming self or others. Pt denies experiencing nightmares. Mood and affect are congruent. Pt appetite is ok. No complaints of anxiety, distress, pain and/or discomfort at this time. Pt's memory appears to be grossly intact, and Pt hasn't displayed any injurious behaviors. Pt is medication compliant. There's no evidence of suicidal intent. Psychomotor activity was WNL. No s/s of Parkinson, Dystonia, Akathisia and/or Tardive Dyskinesia noted.

## 2022-02-03 NOTE — Progress Notes (Addendum)
Notified by nursing concern that patient's L knee laceration required additional dressing this PM due to increased drainage. Went to look at wound with nurse. Wound appears to be intact with scarring on the R and L sides. The center of the wound is still slightly open with minimal yellow drainage. Skin around the wound is non-tender and center of the wound is not actively draining. No erythema or swelling noted around the wound. Do not think the wound is infected or needs to be restapled at this time. Patient has been afebrile, is currently on ppx abx.  -continue to monitor -complete course of bactrim (last dose tomorrow) -advised patient to try not to bend his L knee as much

## 2022-02-03 NOTE — BHH Group Notes (Signed)
sychoEducational group- Goals group. Pt shares his mood is '' anxious '' and his goal is to ''learn more coping skills to help with my anxiety''

## 2022-02-03 NOTE — Progress Notes (Signed)
Pt continues to be paranoid, pt continues to believe other people on the unit may try to hurt him, staff continues to reassure pt that he is safe and will be safe on the unit when he comes off the 1:1

## 2022-02-03 NOTE — Progress Notes (Signed)
   02/03/22 1945  Psych Admission Type (Psych Patients Only)  Admission Status Involuntary  Psychosocial Assessment  Patient Complaints Anxiety;Worrying  Eye Contact Fair  Facial Expression Flat  Affect Anxious;Appropriate to circumstance  Speech Logical/coherent  Interaction Cautious;Guarded  Motor Activity Slow  Appearance/Hygiene Disheveled  Behavior Characteristics Cooperative;Anxious  Mood Anxious;Depressed  Aggressive Behavior  Effect No apparent injury  Thought Process  Coherency Circumstantial  Content Paranoia  Delusions None reported or observed  Perception WDL  Hallucination None reported or observed  Judgment WDL  Confusion None  Danger to Self  Current suicidal ideation? Denies  Danger to Others  Danger to Others None reported or observed

## 2022-02-03 NOTE — Progress Notes (Signed)
Updated mom (Marquitta) on current medications and targeting patient's paranoia. She reports he called her with paranoia asking if his dad is mad at him. He's paranoid that someone is going to harm him. He told her he yelled his address out real loud. He asks her "Am I ok? Am I safe? Am I normal?"  He asked mom to move given that he yelled out address. Called mom 8-9 times today with paranoia. Tuesday is his court date, paranoid about seeing the judge. She reports thinking his mind is like a child's right now.

## 2022-02-03 NOTE — Group Note (Signed)
Occupational Therapy Group Note  Group Topic:Coping Skills  Group Date: 02/01/2022 Start Time: 1400 End Time: 1445 Facilitators: Brantley Stage, OT   Group Description: Group encouraged increased engagement and participation through discussion and activity focused on "Coping Ahead." Patients were split up into teams and selected a card from a stack of positive coping strategies. Patients were instructed to act out/charade the coping skill for other peers to guess and receive points for their team. Discussion followed with a focus on identifying additional positive coping strategies and patients shared how they were going to cope ahead over the weekend while continuing hospitalization stay.  Therapeutic Goal(s): Identify positive vs negative coping strategies. Identify coping skills to be used during hospitalization vs coping skills outside of hospital/at home Increase participation in therapeutic group environment and promote engagement in treatment   Participation Level: Active   Participation Quality: Independent   Behavior: Appropriate   Speech/Thought Process: Loose association    Affect/Mood: Appropriate   Insight: Fair   Judgement: Fair   Individualization: pt was active in their participation of group discussion/activity. New skills identified  Modes of Intervention: Discussion  Patient Response to Interventions:  Attentive   Plan: Continue to engage patient in OT groups 2 - 3x/week.  02/03/2022  Brantley Stage, OT  Cornell Barman, OT

## 2022-02-03 NOTE — Progress Notes (Signed)
   02/03/22 0515  Sleep  Number of Hours 5.5

## 2022-02-03 NOTE — Progress Notes (Signed)
Pt was on the phone appearing to talk to his father, the father could be heard yelling and cursing at pt about not taking medication . Pt had to be reassured that he was safe" am I in protective custody" . Pt continued to be very paranoid especially after talking with his father. Pt has been placed on phone restriction due to pt being agitated and scared after talking to his father on the phone. "You don't know he will send someone to kill me" , "am I going to stay here forever" , "when I get out of here he is going to kill me"
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
## 2022-02-03 NOTE — Progress Notes (Signed)
Pt remains on 1:1 with staff. No s/s of discomfort and/or distress noted. Pt remains cooperative.

## 2022-02-03 NOTE — Progress Notes (Addendum)
DRESSING CHANGE:  Patient request dressing change after showering, due to soiled bandage. Writer changed dressing to left, anterior knee. Wound bed has intact staples. Wound length is approximately 15cm. Width is approximately 1cm. Minimal, light yellow drainage noted. Pt is currently prescribed oral Bactrim DS. Writer dressed wound with fresh bandage, being careful not to adhere adhesive to staples. No c/o pain and/or discomfort. Pt remains comfortable, ambulating in his room.

## 2022-02-03 NOTE — Progress Notes (Signed)
After pt talked to his father, pt needed much redirection, staff believes that the father told him not to take any medication and pt appears to believe he does not need anything to help him calm down and help with his paranoia. Pt continues to believe he is not safe at this time even after staff continues to reassure pt constantly.

## 2022-02-03 NOTE — Group Note (Signed)
[facility] Group Notes: (Clinical Social Work)   02/03/2022      Type of Therapy:  Group Therapy   Participation Level:  Did Not Attend - was invited both individually by MHT and by overhead announcement, chose not to attend.   Selmer Dominion, LCSW 02/03/2022, 1:47 PM

## 2022-02-03 NOTE — Progress Notes (Signed)
Nursing 1:1 note D:Pt observed sleeping in bed with eyes closed. RR even and unlabored. No distress noted. A: 1:1 observation continues for safety  R: pt remains safe  

## 2022-02-03 NOTE — Progress Notes (Signed)
Nursing 1:1 note D:Pt observed  on the phone , father could be heard cursing at pt.staff tried to tell pt that it was after 75 and the phones were going to be cut off, but the father could still be heard yelling at pt. RR even and unlabored. Phone privileges have been suspended at this time for pt safety   A: 1:1 observation continues for safety   R: pt remains safe

## 2022-02-03 NOTE — Progress Notes (Signed)
Pt continued to present very paranoid, worrying if someone will hurt him at the hospital and especially when he goes to sleep. Staff continues to re assure pt about being safe on the unit. Pt stated doctor told him about bing on a close observation then getting off the 1:1 , writer explained that we don't have close observations anymore and that he would have to come off the 1:1 for no one to be with him all the time. Pt continues to fight sleep after taking medications due to his paranoia about falling asleep.

## 2022-02-03 NOTE — Progress Notes (Signed)
Pt in room. No s/s of distress noted. Pt sitting at foot of bed, engaged in conversation with mental health provider.

## 2022-02-04 DIAGNOSIS — F23 Brief psychotic disorder: Secondary | ICD-10-CM | POA: Diagnosis not present

## 2022-02-04 NOTE — Group Note (Signed)
[facility] LCSW Group Therapy Note  Date/Time:  02/04/2022  11:00AM-12:00PM  Type of Therapy and Topic:  Group Therapy:  Music and Mood  Participation Level:  Did Not Attend   Description of Group: In this process group, members listened to a variety of genres of music and identified that different types of music evoke different responses.  Patients were encouraged to identify music that was soothing for them and music that was energizing for them.  Patients discussed how this knowledge can help with wellness and recovery in various ways including managing depression and anxiety as well as encouraging healthy sleep habits.    Therapeutic Goals: Patients will explore the impact of different varieties of music on mood Patients will verbalize the thoughts they have when listening to different types of music Patients will identify music that is soothing to them as well as music that is energizing to them Patients will discuss how to use this knowledge to assist in maintaining wellness and recovery Patients will explore the use of music as a coping skill  Summary of Patient Progress:  Patient was invited to group, did not attend.   Therapeutic Modalities: Solution Focused Brief Therapy Activity   Remo Kirschenmann Grossman-Orr, LCSW   

## 2022-02-04 NOTE — Progress Notes (Signed)
1:1 note   Patient sitting in dayroom with sitter at his side watching TV. Denies any current thoughts of SI/HI/AVH.    Dressing changed to left knee again, patient continues to peel the bandage off. Educated patient on the importance of exposing the wound to as little as possible to prevent infection. Staples (9) are intact but the wound appears to be separating a bit. Provider made aware. No s/s of infection noted.

## 2022-02-04 NOTE — Progress Notes (Signed)
Adult Psychoeducational Group Note  Date:  02/04/2022 Time:  8:34 PM  Group Topic/Focus:  Wrap-Up Group:   The focus of this group is to help patients review their daily goal of treatment and discuss progress on daily workbooks.  Participation Level:  Active  Participation Quality:  Appropriate  Affect:  Appropriate  Cognitive:  Appropriate  Insight: Appropriate  Engagement in Group:  Engaged  Modes of Intervention:  Discussion and Support  Additional Comments:    Pt attended and participated in the [place] group. Pt rated his day 7/10. "I had a good day and stayed in the dayroom a lot". Pt reports making some progress toward using coping skills more consistently. Pt identified deep breathing and meditation as helpful coping skills.  Wetzel Bjornstad Cheryl Chay 02/04/2022, 8:34 PM

## 2022-02-04 NOTE — Progress Notes (Addendum)
0700 1:1 note   Patient is safe in bed with sitter in room.

## 2022-02-04 NOTE — Progress Notes (Signed)
1:1 note   Patient sitting in dayroom with no acute distress. Patient calm and cooperative. Denies SI/HI/AVH. Patient is safe on unit.

## 2022-02-04 NOTE — Progress Notes (Signed)
   02/04/22 0500  Sleep  Number of Hours 5

## 2022-02-04 NOTE — [facility] Group Notes (Signed)
Adult Psychoeducational Group Note Date:  02/04/2022 Time:  0900-1000 Group Topic/Focus: PROGRESSIVE RELAXATION. A group where deep breathing is taught and tensing and relaxation muscle groups is used. Imagery is used as well.  Pts are asked to imagine 3 pillars that hold them up when they are not able to hold themselves up and to share that with the group.   Participation Level:  did not attend   : Glenn Christo A   

## 2022-02-04 NOTE — Progress Notes (Signed)
1:1 note   Patient is calm on unit. Denies SI/HI/AVH. Continues to be paranoid regarding his dad. Safe on unit. Verbal redirection provided.
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
## 2022-02-04 NOTE — Progress Notes (Signed)
1:1 note 0800   Patient resting in room, continues to endorse paranoia regarding someone coming to hurt him. Verbal redirection provided.

## 2022-02-04 NOTE — Progress Notes (Signed)
Nursing 1:1 note D:Pt observed sleeping in bed with eyes closed. RR even and unlabored. No distress noted. A: 1:1 observation continues for safety  R: pt remains safe  

## 2022-02-04 NOTE — Progress Notes (Signed)
1:1 note   Pt sitting in the dayroom, calm at present watching TV. Also interacting with peers. Less paranoid but still somewhat paranoid. Pt is safe on unit.   He is starting to ask if he can come off of the 1:1 status, as well as asking about discharge. Pt educated on attending group meetings, staying safe on the unit, and trying to re-frame his negative thoughts.

## 2022-02-04 NOTE — Progress Notes (Signed)
1:1 note   Pt continues to rest in his room, quiet at present. Denies SI/HI/AVH. Safe on unit.

## 2022-02-04 NOTE — Progress Notes (Signed)
1:1 note   Patient ambulating on unit with writer, patient is calm, processing his feelings and continues to remain safe on the unit.

## 2022-02-04 NOTE — Progress Notes (Signed)
Dressing changed to left knee per patient request. Staples are intact, no redness or s/s of infection noted. Afebrile. Pt cooperative with assessment and bandage change. Education provided to patient on why an oral antibiotic (Bactrim) is needed in addition to topical antibiotic ointment. Pt accepted the education.
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
## 2022-02-04 NOTE — Progress Notes (Signed)
1:1 note   Patient continues to ambulate on the unit. Patient is safe.

## 2022-02-04 NOTE — Progress Notes (Signed)
1:1 note 0900   Pt resting in bed with no acute distress. Calm at present. Sitter at bedside. Patient is safe on unit.

## 2022-02-05 DIAGNOSIS — F23 Brief psychotic disorder: Secondary | ICD-10-CM | POA: Diagnosis not present

## 2022-02-05 DIAGNOSIS — F200 Paranoid schizophrenia: Secondary | ICD-10-CM | POA: Diagnosis not present

## 2022-02-05 DIAGNOSIS — S81012A Laceration without foreign body, left knee, initial encounter: Secondary | ICD-10-CM | POA: Diagnosis not present

## 2022-02-05 DIAGNOSIS — G4700 Insomnia, unspecified: Secondary | ICD-10-CM | POA: Diagnosis present

## 2022-02-05 DIAGNOSIS — R45851 Suicidal ideations: Secondary | ICD-10-CM | POA: Diagnosis not present

## 2022-02-05 DIAGNOSIS — R45850 Homicidal ideations: Secondary | ICD-10-CM | POA: Diagnosis not present

## 2022-02-05 MED ORDER — DOCUSATE SODIUM 100 MG PO CAPS
100.0000 mg | ORAL_CAPSULE | Freq: Two times a day (BID) | ORAL | Status: DC
  Administered 2022-02-05 – 2022-02-08 (×6): 100 mg via ORAL
  Filled 2022-02-05 (×11): qty 1

## 2022-02-05 MED ORDER — SODIUM CHLORIDE 0.9 % IN NEBU
INHALATION_SOLUTION | RESPIRATORY_TRACT | Status: AC
  Filled 2022-02-05: qty 3

## 2022-02-05 MED ORDER — HALOPERIDOL 5 MG PO TABS
10.0000 mg | ORAL_TABLET | Freq: Three times a day (TID) | ORAL | Status: DC
  Administered 2022-02-05 – 2022-02-08 (×9): 10 mg via ORAL
  Filled 2022-02-05 (×16): qty 2

## 2022-02-05 NOTE — Progress Notes (Signed)
   02/05/22 0604  Sleep  Number of Hours 6

## 2022-02-05 NOTE — Progress Notes (Signed)
Pt visible in hall at intervals this morning. Denies SI, HI, AVH and pain when assessed. Presents with flat affect, is anxious, remains preoccupied and paranoid on interactions. Per pt "I believe my dad is after me, I know he's trying to get me". However; pt is verbally redirectable at this time. Compliant with medications when offered, denies adverse drug reactions. Tolerated breakfast well. 1:1 observation maintained as ordered with assigned staff in attendance at all times. Emotional support, encouragement and reassurance provided to pt. Verbal education done on current treatment regimen. Pt verbalized understanding. Safety maintained in milieu.

## 2022-02-05 NOTE — Progress Notes (Addendum)
   02/05/22 2200  Vitals  BP 111/71  BP Location Left Arm  Pulse Rate 74  Resp 18  Level of Consciousness  Level of Consciousness Alert  Oxygen Therapy  SpO2 98 %   Patient alert and oriented in room, patient reported feeling lightheaded. Vitals were checked and within parameters. Patient was provided fluids to drink and requested to have a snack. Patient transferred well back to bed and continued to eat snack. Patient reported pain in the left knee 5/10, with 0 being the least pain and 10 being the worst pain. Patient provided with heat and cold pack to alternate. Will continue to monitor. Patient has 1:1 sitter, Q15 minute safety checks and remains safe on the unit.

## 2022-02-05 NOTE — Progress Notes (Signed)
Adult Psychoeducational Group Note  Date:  02/05/2022 Time:  8:37 PM  Group Topic/Focus:  Wrap-Up Group:   The focus of this group is to help patients review their daily goal of treatment and discuss progress on daily workbooks.  Participation Level:  Active  Participation Quality:  Appropriate  Affect:  Appropriate  Cognitive:  Appropriate  Insight: Appropriate  Engagement in Group:  Engaged  Modes of Intervention:  Discussion and Support  Additional Comments:   Pt attended and participated in the [place] group. Pt rated his day 10/10. Pt has made some progress toward his goal of discontinuing 1:1 by following treatment and safety rules and behavioral expectations. Pt also reports increased sleep and medication compliance. Pt shared that he practice silent praying and deep breathing to improve coping.  Benjamin Werner 02/05/2022, 8:37 PM

## 2022-02-05 NOTE — Group Note (Signed)
Recreation Therapy Group Note   Group Topic:Communication  Group Date: 02/05/2022 Start Time: 1000 End Time: 1035 Facilitators: Patte Winkel-McCall, LRT,CTRS Location: [address] Dayroom   Goal Area(s) Addresses:  Patient will effectively listen to complete activity.  Patient will identify communication skills used to make activity successful.  Patient will identify how skills used during activity can be used to reach post d/c goals.    Group Description:  Geometric Drawings.  Three volunteers from the peer group will be shown an abstract picture with a particular arrangement of geometrical shapes.  Each round, one 'speaker' will describe the pattern, as accurately as possible without revealing the image to the group.  The remaining group members will listen and draw the picture to reflect how it is described to them. Patients with the role of 'listener' cannot ask clarifying questions but, may request that the speaker repeat a direction. Once the drawings are complete, the presenter will show the rest of the group the picture and compare how close each person came to drawing the picture. LRT will facilitate a post-activity discussion regarding effective communication and the importance of planning, listening, and asking for clarification in daily interactions with others.   Affect/Mood: Drowsy   Participation Level: Active   Participation Quality: Independent   Behavior: Appropriate   Speech/Thought Process: Focused   Insight: Good   Judgement: Good   Modes of Intervention: Art   Patient Response to Interventions:  Engaged   Education Outcome:  Acknowledges education and In group clarification offered    Clinical Observations/Individualized Feedback: Pt came halfway into group.  Pt was able to catch on to the activity once it was explained.  Pt appeared drowsy during group session but was able to focus enough to participate.  Pt was able to express that one of the important  things in communication, is "reading the room".  Pt explained that as being able to read the emotions of others.  Pt also stated that people don't always get it right when trying to gage the emotions of others.     Plan: Continue to engage patient in RT group sessions 2-3x/week.   Kaytlan Behrman-McCall, LRT,CTRS 02/05/2022 12:24 PM
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
## 2022-02-05 NOTE — Progress Notes (Signed)
   02/04/22 2200  Psych Admission Type (Psych Patients Only)  Admission Status Involuntary  Psychosocial Assessment  Patient Complaints Anxiety;Worrying  Eye Contact Fair  Facial Expression Flat  Affect Anxious  Speech Logical/coherent  Interaction Cautious;Guarded  Motor Activity Slow  Appearance/Hygiene In scrubs  Behavior Characteristics Guarded  Mood Depressed;Anxious;Preoccupied  Aggressive Behavior  Effect No apparent injury  Thought Process  Coherency Circumstantial  Content Paranoia  Delusions Persecutory  Perception Depersonalization  Hallucination None reported or observed  Judgment Poor  Confusion None  Danger to Self  Current suicidal ideation? Denies  Danger to Others  Danger to Others None reported or observed

## 2022-02-05 NOTE — Progress Notes (Signed)
   02/05/22 2033  Psych Admission Type (Psych Patients Only)  Admission Status Involuntary  Psychosocial Assessment  Patient Complaints Anxiety;Worrying  Eye Contact Fair  Facial Expression Flat  Affect Anxious  Speech Logical/coherent  Interaction Cautious;Guarded  Motor Activity Slow  Appearance/Hygiene Unremarkable  Behavior Characteristics Anxious;Cooperative  Mood Anxious;Preoccupied  Aggressive Behavior  Effect No apparent injury  Thought Process  Coherency Circumstantial  Content Preoccupation;Paranoia;Religiosity  Delusions Paranoid;Religious;Persecutory  Perception Derealization  Hallucination None reported or observed  Judgment Poor  Confusion None  Danger to Self  Current suicidal ideation? Denies  Danger to Others  Danger to Others None reported or observed   Patient alert and oriented. Presenting anxious and preoccupied. Upon entering patient room with staff present, patient asks "Are you guys angels? You got here in just the right time. Can you guys keep me safe when I leave the hospital?". Patient reports he is anxious and worries about his safety upon discharge. Patient denied SI/HI/AVH, however presents preoccupied and redirectable. Patient asking questions centered around religion throughout shift, "Do you think I am going to go to heaven, is it too late for me to be saved?" Scheduled medications and PRN administered to patient, per provider orders. Support and encouragement provided. Routine safety checks conducted every 15 minutes. Patient verbally contracts for safety, and remains safe on the unit.

## 2022-02-05 NOTE — Progress Notes (Signed)
Pt cooperative with care. Tolerated meals, fluids and medications well. Continued support, encouragement and reassurance provided to pt. Safety maintained with 1:1 observation level, assigned staff in attendance without issues.

## 2022-02-05 NOTE — ED Notes (Signed)
RN verbally confirmed with Dr. Regenia Skeeter that he wanted hard violent restraints. Pt  placed in wrist restraints and RN was going to get the pt water. As the Rn was walking away she hear Velcro and went to check on the pt and he had broken the side rail of the bed and gotten out of the restraints. EDP notified and medications ordered. Pt was becoming increasingly aggressive with this RN and EDP. Security notified they were needed at bedside. Pt went into other rooms and pacing. Pt back in room given water and then got more aggressive with security. Pt placed back into restraints and sitter is now at bedside with security

## 2022-02-05 NOTE — Group Note (Signed)
[facility] LCSW Group Therapy Note    Group Date: 02/05/2022 Start Time: 1300 End Time: 1400  Type of Therapy and Topic:  Group Therapy:  Overcoming Obstacles  Participation Level:  [facility] PARTICIPATION LEVEL: Minimal    Description of Group:   In this group patients will be encouraged to explore what they see as obstacles to their own wellness and recovery. They will be guided to discuss their thoughts, feelings, and behaviors related to these obstacles. The group will process together ways to cope with barriers, with attention given to specific choices patients can make. Each patient will be challenged to identify changes they are motivated to make in order to overcome their obstacles. This group will be process-oriented, with patients participating in exploration of their own experiences as well as giving and receiving support and challenge from other group members.  Therapeutic Goals: 1. Patient will identify personal and current obstacles as they relate to admission. 2. Patient will identify barriers that currently interfere with their wellness or overcoming obstacles.  3. Patient will identify feelings, thought process and behaviors related to these barriers. 4. Patient will identify two changes they are willing to make to overcome these obstacles:    Summary of Patient Progress   Patient insight was poor. Patient only answered a question once being called on. Patient said he was tired after taking his medications.    Therapeutic Modalities:   Cognitive Behavioral Therapy Solution Focused Therapy Motivational Interviewing Relapse Prevention Therapy   Sherre Lain, LCSWA

## 2022-02-06 DIAGNOSIS — F23 Brief psychotic disorder: Secondary | ICD-10-CM | POA: Diagnosis not present

## 2022-02-06 MED ORDER — PROPRANOLOL HCL 10 MG PO TABS
5.0000 mg | ORAL_TABLET | Freq: Two times a day (BID) | ORAL | Status: AC
  Administered 2022-02-07: 5 mg via ORAL
  Filled 2022-02-06 (×2): qty 0.5

## 2022-02-06 NOTE — Progress Notes (Signed)
Nursing 1:1 Note  D: Patient in his room. Respirations even and unlabored. No distress noted.  A: 1:1 continued for safety R: Patient remains safe.

## 2022-02-06 NOTE — Progress Notes (Signed)
Patient administered scheduled cogentin, zyprexa, and haldol per MAR. Patient reported feeling anxious and becoming increasingly worried. When asked if there was anything in particular making them anxious, they stated "nothing you need to worry about". Ativan given per MAR.

## 2022-02-06 NOTE — Progress Notes (Signed)
   02/06/22 0529  Sleep  Number of Hours 4

## 2022-02-06 NOTE — Progress Notes (Signed)
Patient reported anxiety, patient was informed PRN had been administered. Redirectable. Q15 minute checks. Safety maintained with 1:1 observation, assigned staff in attendance.

## 2022-02-06 NOTE — Group Note (Signed)
Recreation Therapy Group Note   Group Topic:Self-Esteem  Group Date: 02/06/2022 Start Time: 1000 End Time: 1779 Facilitators: Keleigh Kazee-McCall, LRT,CTRS Location: [address] Dayroom   Goal Area(s) Addresses:  Patient will successfully identify positive attributes about themselves.  Patient will identify healthy ways to increase self-esteem. Patient will acknowledge benefit(s) of improved self-esteem.   Group Description:  Wellsite geologist.  LRT and patients discussed how the views of others impact how we feel about ourselves.  Patients also discussed the importance of having positive thoughts about one's self.  LRT then gave patients a blank outline of a picture frame.  Patients were to take the picture frame and create a picture of themselves using positive images and language. In addition, to the blank picture frames, patients were given markers to create their vision.  LRT also played music for patients as they worked on [employer]   Affect/Mood: Drowsy   Participation Level: Minimal   Participation Quality: Independent   Behavior: Apprehensive    Speech/Thought Process: Logical   Insight: Fair   Judgement: Fair    Modes of Intervention: Art and Music   Patient Response to Interventions:  Sleepy   Education Outcome:  Acknowledges education and In group clarification offered    Clinical Observations/Individualized Feedback: Pt came for the last few minutes of group.  Pt leaned his head against the wall and went straight to sleep.  Pt eventually woke up long enough to describe himself as strong.  Pt sat the remainder of time with his eyes closed as if resting.      Plan: Continue to engage patient in RT group sessions 2-3x/week.   Vaanya Shambaugh-McCall, LRT,CTRS 02/06/2022 12:22 PM

## 2022-02-06 NOTE — Progress Notes (Signed)
   02/06/22 1945  Psych Admission Type (Psych Patients Only)  Admission Status Involuntary  Psychosocial Assessment  Patient Complaints None  Eye Contact Fair  Facial Expression Animated  Affect Appropriate to circumstance  Speech Logical/coherent  Interaction Assertive  Motor Activity Slow  Appearance/Hygiene Improved  Behavior Characteristics Appropriate to situation;Cooperative;Calm  Mood Pleasant  Thought Process  Coherency Circumstantial  Content Preoccupation  Delusions None reported or observed  Perception Derealization  Hallucination None reported or observed  Judgment Poor  Confusion None  Danger to Self  Current suicidal ideation? Denies  Danger to Others  Danger to Others None reported or observed   Patient alert and oriented. Presenting appropriate to circumstance and pleasant. Patient denies SI, HI, AVH, and pain. Patient denies any symptoms of anxiety and depression and states "nope just doing and feeling good. I am sleeping good." Patient also states " I think the medicine they have me on doesn't let me feel too happy, too sad, or too angry." Support and encouragement provided. Routine safety checks conducted every 15 minutes. Patient contracts for safety and remains safe on the unit.

## 2022-02-06 NOTE — Progress Notes (Signed)
   02/06/22 1000  Psych Admission Type (Psych Patients Only)  Admission Status Involuntary  Psychosocial Assessment  Patient Complaints Anxiety;Worrying  Eye Contact Fair  Facial Expression Flat  Affect Anxious  Speech Logical/coherent  Interaction Assertive;Cautious;Minimal  Motor Activity Slow  Appearance/Hygiene Improved  Behavior Characteristics Guarded;Anxious  Mood Anxious;Preoccupied  Aggressive Behavior  Effect No apparent injury  Thought Process  Coherency Circumstantial  Content Paranoia;Preoccupation  Delusions Paranoid;Persecutory  Perception Derealization  Hallucination None reported or observed  Judgment Poor  Confusion None  Danger to Self  Current suicidal ideation? Denies  Danger to Others  Danger to Others None reported or observed

## 2022-02-06 NOTE — Progress Notes (Signed)
Nursing 1:1 note  D: Patient in his room. Talking with MHT. Respirations even and unlabored. No distress noted. A: Patient remains on 1:1 for safety R: Patient remains safe.

## 2022-02-06 NOTE — Progress Notes (Signed)
The focus of this group is to help patients review their daily goal of treatment and discuss progress on daily workbooks. Pt did not attend the evening group. 

## 2022-02-06 NOTE — Progress Notes (Signed)
1:1 Note  Patient asleep in room, respirations are even and unlabored. No distress noted. Safety maintained with 1:1 observation level, assigned staff in attendance.

## 2022-02-06 NOTE — Progress Notes (Addendum)
Patient asleep in room at this time. Safety maintained with 1:1 observation level, assigned staff in attendance.

## 2022-02-06 NOTE — Progress Notes (Signed)
Nursing 1:1 Note D: Patient in his room. Talking with MHT. No distress noted. A: 1:1 continued for safety R: Patient remains safe.

## 2022-02-06 NOTE — Progress Notes (Signed)
Patient asleep in room. Safety maintained with 1:1 observation level, assigned staff in attendance.

## 2022-02-06 NOTE — Progress Notes (Addendum)
[facility] MD Progress Note  02/06/2022 4:46 PM Phillippe Orlick  MRN:  [id number] Principal Problem: Brief psychotic disorder (HCC) Diagnosis: Principal Problem:   Brief psychotic disorder (HCC) Active Problems:   Anxiety state   Laceration of left knee   Insomnia  Reason For Admission:Keenon Hyams is a 21 y.o., male with significant past psychiatric history of unspecified depression who presents to the [facility] Voluntary from  [facility] Emergency Department for evaluation and management of gross disorganization and bizarre behavior, self reported anxiety and panic attack, and reported suicidal statement in the setting of cannabis use 1 week ago (admitted on 01/30/2022).  24 hour chart review: Blood pressure within normal limits, heart rate slightly elevated at 120 earlier today morning, but has since normalized.  Patient continues to take medications as scheduled.  Required trazodone 50 mg for sleep last night, has not required any agitation protocol medications in the past 24 hrs. Slept for 4 hours last night as per nursing flow sheets.  As per nursing reports and documentation, presentation continues to be anxious, paranoid, preoccupied, required 1:1 monitoring in the last 24 hours. Has attended some group sessions in the past 24 hours, and was cooperative.  Patient assessment note (02/06/2022): Today patient continues to present with ruminations and perseverations about being afraid that his father will harm him after discharge from this hospital.  He is paranoid and reports feeling safe only at this hospital and only while being monitored by a one-on-one staff member.He continues to present with delusions of persecution, states: "My father will possibly send someone to bother me. I made my dad angry." He reports during this encounter that he has not spoken to his father in 2 years, and recently spoke to him and "made him angry."  Collateral information obtained from pt's mother  Cristal Ford Wright-[phone]) regarding pt's relationship with his father. Pt's mother shared that pt had not spoken to his father in two years, and when he recently spoke to his father, he verbalized being upset that pt had not reached out to him prior to that. Pt's mother also stated that pt looks up to his father as his role model, and pt's father is a Dentist, and told pt not to take any medications. Mother reports that pt might be concerned of what his father will do when he finds out that he is on medications.   Writer spent 30 minutes on phone educating pt's mother on current medications; benefits, rationales and possible side effects. Mother expressed concern that during visit yesterday, pt was "shivering". Writer informed mother that no shivering has been observed today by Clinical research associate, attending psychiatrist or pt's Rns and MHTs, and that pt is being monitored for all medication related side effects. Writer educated mother on rationale of use of 2 antipsychotics currently, and also educated mother that should pt's psychosis not resolved on current medications, it will become necessary to use Clozaril to alleviate his psychosis and stabilize his mental status. Pt's mother educated that current medications so far, have been helpful in resolving physical aggression that he exhibited at start of this hospitalization. Pt's mother verbalized understanding.   Pt denies SI/HI/AVH today, continues to be paranoia and have delusional thinking as above. He reports a good sleep quality last night, reports a good appetite, reported feeling tired earlier today morning, but reported feeling less tired as the afternoon approached. We are continuing all medications as listed below, will reassess tomorrow morning. Insight remains poor and judgment remains poor. No TD/EPS type  symptoms found on assessment, and pt denies any feelings of stiffness. AIMS: 0.   Total Time spent with patient: 45 minutes  Past Psychiatric  History: See H & P  Past Medical History:  Past Medical History:  Diagnosis Date   Anxiety     Past Surgical History:  Procedure Laterality Date   NO PAST SURGERIES     Family History: History reviewed. No pertinent family history. Family Psychiatric  History: See H & P Social History:  Social History   Substance and Sexual Activity  Alcohol Use Yes   Comment: occassionally     Social History   Substance and Sexual Activity  Drug Use Yes   Types: Marijuana    Social History   Socioeconomic History   Marital status: Single    Spouse name: Not on file   Number of children: Not on file   Years of education: Not on file   Highest education level: Not on file  Occupational History   Not on file  Tobacco Use   Smoking status: Never   Smokeless tobacco: Never  Substance and Sexual Activity   Alcohol use: Yes    Comment: occassionally   Drug use: Yes    Types: Marijuana   Sexual activity: Not Currently  Other Topics Concern   Not on file  Social History Narrative   Not on file   Social Determinants of Health   Financial Resource Strain: Not on file  Food Insecurity: No Food Insecurity (01/30/2022)   Hunger Vital Sign    Worried About Running Out of Food in the Last Year: Never true    Ran Out of Food in the Last Year: Never true  Transportation Needs: No Transportation Needs (01/30/2022)   PRAPARE - Administrator, Civil Service (Medical): No    Lack of Transportation (Non-Medical): No  Physical Activity: Not on file  Stress: Not on file  Social Connections: Not on file   Sleep: Good  Appetite:  Good  Current Medications: Current Facility-Administered Medications  Medication Dose Route Frequency Provider Last Rate Last Admin   acetaminophen (TYLENOL) tablet 650 mg  650 mg Oral Q6H PRN Onuoha, Chinwendu V, NP   650 mg at 02/04/22 1818   alum & mag hydroxide-simeth (MAALOX/MYLANTA) 200-200-20 MG/5ML suspension 30 mL  30 mL Oral Q4H PRN Onuoha,  Chinwendu V, NP       benztropine (COGENTIN) tablet 1 mg  1 mg Oral Q12H Massengill, Nathan, MD   1 mg at 02/06/22 1017   benztropine mesylate (COGENTIN) injection 2 mg  2 mg Intramuscular BID PRN Massengill, Harrold Donath, MD       haloperidol (HALDOL) tablet 5 mg  5 mg Oral TID PRN Phineas Inches, MD   5 mg at 02/04/22 1529   And   LORazepam (ATIVAN) tablet 2 mg  2 mg Oral TID PRN Phineas Inches, MD   2 mg at 02/04/22 1818   And   diphenhydrAMINE (BENADRYL) capsule 50 mg  50 mg Oral TID PRN Phineas Inches, MD   50 mg at 02/04/22 0820   haloperidol lactate (HALDOL) injection 5 mg  5 mg Intramuscular TID PRN Massengill, Harrold Donath, MD       And   LORazepam (ATIVAN) injection 2 mg  2 mg Intramuscular TID PRN Massengill, Harrold Donath, MD       And   diphenhydrAMINE (BENADRYL) injection 50 mg  50 mg Intramuscular TID PRN Massengill, Harrold Donath, MD       docusate sodium (COLACE)  capsule 100 mg  100 mg Oral BID Massengill, Ovid Curd, MD   100 mg at 02/06/22 1643   haloperidol (HALDOL) tablet 10 mg  10 mg Oral Q8H Massengill, Nathan, MD   10 mg at 02/06/22 1320   hydrOXYzine (ATARAX) tablet 25 mg  25 mg Oral TID PRN Onuoha, Chinwendu V, NP   25 mg at 02/05/22 2039   LORazepam (ATIVAN) tablet 1 mg  1 mg Oral Q6H PRN Massengill, Ovid Curd, MD   1 mg at 02/03/22 1417   magnesium hydroxide (MILK OF MAGNESIA) suspension 30 mL  30 mL Oral Daily PRN Onuoha, Chinwendu V, NP       OLANZapine (ZYPREXA) tablet 15 mg  15 mg Oral QHS Rolanda Lundborg, MD   15 mg at 02/05/22 2040   propranolol (INDERAL) tablet 5 mg  5 mg Oral Q12H Massengill, Nathan, MD       traZODone (DESYREL) tablet 50 mg  50 mg Oral QHS PRN Evette Georges, NP   50 mg at 02/05/22 2035    Lab Results: No results found for this or any previous visit (from the past 65 hour(s)).  Blood Alcohol level:  Lab Results  Component Value Date   ETH <10 01/27/2022   ETH <10 48/54/6270    Metabolic Disorder Labs: No results found for: "HGBA1C", "MPG" No results  found for: "PROLACTIN" No results found for: "CHOL", "TRIG", "HDL", "CHOLHDL", "VLDL", "LDLCALC"  Physical Findings: AIMS: Facial and Oral Movements Muscles of Facial Expression: None, normal Lips and Perioral Area: None, normal Jaw: None, normal Tongue: None, normal,Extremity Movements Upper (arms, wrists, hands, fingers): None, normal Lower (legs, knees, ankles, toes): None, normal, Trunk Movements Neck, shoulders, hips: None, normal, Overall Severity Severity of abnormal movements (highest score from questions above): None, normal Incapacitation due to abnormal movements: None, normal Patient's awareness of abnormal movements (rate only patient's report): No Awareness, Dental Status Current problems with teeth and/or dentures?: No Does patient usually wear dentures?: No  CIWA:    COWS:     Musculoskeletal: Strength & Muscle Tone: within normal limits Gait & Station: normal Patient leans: N/A  Psychiatric Specialty Exam:  Presentation  General Appearance:  Appropriate for Environment; Fairly Groomed  Eye Contact: Good  Speech: Clear and Coherent  Speech Volume: Normal  Handedness: Right  Mood and Affect  Mood: Depressed; Anxious  Affect: Congruent  Thought Process  Thought Processes: Disorganized  Descriptions of Associations:Intact  Orientation:Partial  Thought Content:Illogical  History of Schizophrenia/Schizoaffective disorder:No data recorded Duration of Psychotic Symptoms:No data recorded Hallucinations:Hallucinations: None  Ideas of Reference:Paranoia; Delusions; Percusatory  Suicidal Thoughts:Suicidal Thoughts: No  Homicidal Thoughts:Homicidal Thoughts: No  Sensorium  Memory: Immediate Good  Judgment: Poor  Insight: Poor  Executive Functions  Concentration: Poor  Attention Span: Fair  Recall: [place] of Knowledge: Fair  Language: Fair  Psychomotor Activity  Psychomotor Activity:Psychomotor Activity:  Normal  Assets  Assets: Communication Skills  Sleep  Sleep:Sleep: Good   Physical Exam: Physical Exam Constitutional:      Appearance: He is obese.  HENT:     Head: Normocephalic.     Nose: Nose normal. No congestion or rhinorrhea.  Eyes:     Pupils: Pupils are equal, round, and reactive to light.  Pulmonary:     Effort: Pulmonary effort is normal. No respiratory distress.  Musculoskeletal:        General: Normal range of motion.     Cervical back: Normal range of motion.  Neurological:     Mental Status:  He is oriented to person, place, and time.    Review of Systems  Constitutional:  Negative for fever.  HENT: Negative.  Negative for hearing loss and sore throat.   Eyes:  Negative for blurred vision.  Respiratory:  Negative for cough.   Cardiovascular:  Negative for chest pain.  Gastrointestinal:  Negative for heartburn.  Genitourinary:  Negative for dysuria.  Musculoskeletal:  Negative for myalgias.  Skin: Negative.   Neurological: Negative.  Negative for dizziness.  Psychiatric/Behavioral:  Positive for depression, hallucinations (paranoia and delusional thinking) and substance abuse. Negative for memory loss and suicidal ideas. The patient is nervous/anxious and has insomnia (improving).    Blood pressure 122/80, pulse 66, temperature 97.7 F (36.5 C), temperature source Oral, resp. rate 18, height 5\' 11"  (1.803 m), weight 104.3 kg, SpO2 100 %. Body mass index is 32.08 kg/m.  Treatment Plan Summary: Daily contact with patient to assess and evaluate symptoms and progress in treatment and Medication management  Observation Level/Precautions:  1 to 1  Laboratory:  Labs reviewed   Psychotherapy:  Unit Group sessions  [facility]  Consultations:  To be determined   Discharge Concerns:  Safety, medication compliance, mood stability  Estimated LOS: 5-7 days  Other:  N/A   PLAN Safety and Monitoring: Involuntary admission to inpatient psychiatric unit  for safety, stabilization and treatment Daily contact with patient to assess and evaluate symptoms and progress in treatment Patient's case to be discussed in multi-disciplinary team meeting Observation Level : 1:1 safety monitoring Vital signs: q12 hours Precautions: Safety  Long Term Goal(s): Improvement in symptoms so as ready for discharge  Short Term Goals: Ability to identify changes in lifestyle to reduce recurrence of condition will improve, Ability to verbalize feelings will improve, Ability to disclose and discuss suicidal ideas, Ability to demonstrate self-control will improve, Ability to identify and develop effective coping behaviors will improve, Compliance with prescribed medications will improve, and Ability to identify triggers associated with substance abuse/mental health issues will improve  Diagnoses:  Principal Problem:   Brief psychotic disorder (HCC) Active Problems:   Anxiety state   Laceration of left knee   Insomnia  Medications -Continue Haldol to 10 mg q8 hours for psychosis, agitation (Repeat EKG ordered to trend Qtc with increase in dose of antipsychotic. Last Qtc on 11/03 with QTC of 429. Lipid panel and Hemoglobin A1C ordered and pending). -Continue benztropine 1 mg q12 hours for EPS prophylaxis while on a first generation antipsychotic -Continue Zyprexa 15mg  QHS for psychosis -Continue Hydroxyzine 25 mg TID PRN for anxiety -Continue Ativan Q 6 H PRN for anxiety -Continue Trazodone, 50 mg nightly PRN for sleep -Continue Agitation Protocol (Haldol 5 mg/ Ativan 2 mg/Benadryl 50 mg PO/IM TID PRN) -Continue Colace 100 mg daily for constipation  Other PRNS -Continue Tylenol 650 mg every 6 hours PRN for mild pain -Continue Maalox 30 mg every 4 hrs PRN for indigestion -Continue Milk of Magnesia as needed every 6 hrs for constipation  Discharge Planning: Social work and case management to assist with discharge planning and identification of hospital  follow-up needs prior to discharge Estimated LOS: 5-7 days Discharge Concerns: Need to establish a safety plan; Medication compliance and effectiveness Discharge Goals: Return home with outpatient referrals for mental health follow-up including medication management/psychotherapy  I certify that inpatient services furnished can reasonably be expected to improve the patient's condition.    Starleen Blueoris  Shley Dolby, NP 11/7/20234:46 PM  Patient ID: Lynda Rainwatermir Heo, male   DOB: 11/05/2000,  21 y.o.   MRN: [id number]

## 2022-02-07 ENCOUNTER — Encounter (HOSPITAL_COMMUNITY): Payer: Self-pay

## 2022-02-07 DIAGNOSIS — F23 Brief psychotic disorder: Secondary | ICD-10-CM | POA: Diagnosis not present

## 2022-02-07 LAB — LIPID PANEL
Cholesterol: 93 mg/dL (ref 0–200)
HDL: 29 mg/dL — ABNORMAL LOW (ref >40)
LDL Cholesterol: 42 mg/dL (ref 0–99)
Total CHOL/HDL Ratio: 3.2 RATIO
Triglycerides: 109 mg/dL (ref <150)
VLDL: 22 mg/dL (ref 0–40)

## 2022-02-07 LAB — HEMOGLOBIN A1C
Hgb A1c MFr Bld: 4.9 % (ref 4.8–5.6)
Mean Plasma Glucose: 93.93 mg/dL

## 2022-02-07 NOTE — Progress Notes (Addendum)
[facility] Post 1:1 Observation Documentation  For the first (8) hours following discontinuation of 1:1 precautions, a progress note entry by nursing staff should be documented at least every 2 hours, reflecting the patient's behavior, condition, mood, and conversation.  Use the progress notes for additional entries.  Time 1:1 discontinued:  0959  Patient's Behavior:  paranoid, fidgety, hyper-religious  Patient's Condition:  Restless, pacing, fidgety and needy.  Patient's Conversation:  Pt demanding prayers from every encounter with staff. Still demanding witness protection from his father. However, he does recognizes that he's paranoid during interactions. Called his mother at multiple intervals praying and reading the Bible with her on phone in dayroom in between groups. Continues to need multiple redirections.  Pt received PRN Vistaril at 1012 for anxiety, restlessness with minimal effect when reassessed at 1110.   Ouida Sills, Lincoln Maxin 02/07/2022, 1400

## 2022-02-07 NOTE — Group Note (Signed)
Type of Therapy and Topic: Group Therapy: Gratitude  Participation: Active  Description of Group: The purpose behind this group is to get people thinking about things for which  they can be grateful. If continued over time, they might begin to spontaneously look for things and  situations for which to be grateful. Gratitude is related to a "wide variety of forms of wellbeing , whereas "negative attributions" can adversely affect relationships.  Several studies have shown that interventions to  increase gratitude can impact areas such as overall life satisfaction, decreased negative affect, increased happiness, the ability to provide emotional support to others, and decreased worrying.   Therapeutic Goals: Patient will learn activities that focus on gratitude in their daily lives. Patient will share gratitude in their daily lives. Patient will learn to develop healthy habits and positive thinking techniques. Patient will receive support and feedback from others  Therapeutic Modalities: Cognitive Behavioral Therapy Solution Focused Therapy Motivational Interviewing   Akacia Boltz, LCSW, LCAS Clincal Social Worker  [facility]   

## 2022-02-07 NOTE — Progress Notes (Signed)
Pt visible in bed, awake on initial contact. Denies SI,HI, AVH and pain when assessed. However, he remains preoccupied and paranoid. Reports he slept well last night with good appetite. Automotive engineer to ask the doctor for extended protection according to him. Per pt "Can you ask the doctor to get the police to patrol my neighborhood when I'm d/c. I still need the help. I know it makes me feel paranoid but I need it for extra protection from my father. I know I need the help". Required verbal redirections on interactions. Emotional support, encouragement and reassurance provided to pt. 1:1 observation d/c at 0959 this shift. Safety checks initiated at Q 15 minutes intervals. Verbal education provided on current treatment regimen. Pt tolerated breakfast and fluids well. Continues to need frequent verbal redirections thus far.

## 2022-02-07 NOTE — Progress Notes (Signed)
Adult Psychoeducational Group Note  Date:  02/07/2022 Time:  8:41 PM  Group Topic/Focus:  Wrap-Up Group:   The focus of this group is to help patients review their daily goal of treatment and discuss progress on daily workbooks.  Participation Level:  Active  Participation Quality:  Appropriate  Affect:  Appropriate  Cognitive:  Appropriate  Insight: Appropriate  Engagement in Group:  Engaged  Modes of Intervention:  Discussion and Support  Additional Comments:   Pt attended and participated in the Wrap Up group. Pt rated his day 10/10. "I had a great day". Pt reports making progress toward his goal of treatment discharge. "I get discharged tomorrow". Pt reports using deep breathing, relaxing, sleep and snacking as effective coping strategies to improve his mood and daily coping.  Benjamin Werner 02/07/2022, 8:41 PM

## 2022-02-07 NOTE — Group Note (Signed)
Recreation Therapy Group Note   Group Topic:Team Building  Group Date: 02/07/2022 Start Time: 1000 End Time: 1025 Facilitators: Beauregard Jarrells-McCall, LRT,CTRS Location: [address] Dayroom   Goal Area(s) Addresses:  Patient will effectively work with peer towards shared goal.  Patient will identify skills used to make activity successful.  Patient will identify how skills used during activity can be used to reach post d/c goals.   Group Description: Landing Pad. In teams of 3-5, patients were given 12 plastic drinking straws and an equal length of masking tape. Using the materials provided, patients were asked to build a landing pad to catch a golf ball dropped from approximately 5 feet in the air. All materials were required to be used by the team in their design. LRT facilitated post-activity discussion.   Affect/Mood: Flat   Participation Level: Minimal   Participation Quality: Independent   Behavior: Paranoid   Speech/Thought Process: Delusional   Insight: Lacking   Judgement: Lacking    Modes of Intervention: STEM Activity   Patient Response to Interventions:  Challenging    Education Outcome:  Acknowledges education and In group clarification offered    Clinical Observations/Individualized Feedback: Pt had minimal engagement.  Pt was focused on call his dad and disappointed his dad won't answer when he calls.  Pt was unable to focus on the activity.  Pt was demonstrating paranoid behavior by repeating the same questions over and over.  Pt asked repeatedly if other people would be able to see his medical records and see the things he has been saying while here.  Pt was also asking if he was safe and if he would be safe when the group goes outside.  In addition to that, pt asked if he was attacked outside, how long would it take for the police to get here.  Pt was reassured he would be safe and no one would hurt him.  However, pt kept asking these questions.    Plan:  Continue to engage patient in RT group sessions 2-3x/week.   Ronin Crager-McCall, LRT,CTRS 02/07/2022 11:51 AM

## 2022-02-07 NOTE — Progress Notes (Signed)
[facility] Post 1:1 Observation Documentation  For the first (8) hours following discontinuation of 1:1 precautions, a progress note entry by nursing staff should be documented at least every 2 hours, reflecting the patient's behavior, condition, mood, and conversation.  Use the progress notes for additional entries.  Time 1:1 discontinued:  0959  Patient's Behavior:  Pt is restless, fidgety, pacing hall at intervals.  Patient's Condition:  Anxious, restless  Patient's Conversation: "Can you pray with me. I need you to pray aloud with me".    Shaft Corigliano 1200 PM

## 2022-02-07 NOTE — Progress Notes (Signed)
[facility] MD Progress Note  02/07/2022 4:24 PM Benjamin Werner  MRN:  [id number] Principal Problem: Brief psychotic disorder (HCC) Diagnosis: Principal Problem:   Brief psychotic disorder (HCC) Active Problems:   Anxiety state   Laceration of left knee   Insomnia  Reason For Admission:Benjamin Werner is a 21 y.o., male with significant past psychiatric history of unspecified depression who presents [facility] the [facility] from  [facility] Emergency Department for evaluation and management of gross disorganization and bizarre behavior, self reported anxiety and panic attack, and reported suicidal statement in the setting of cannabis use 1 week ago (admitted on 01/30/2022).  24 hour chart review: Blood pressure within normal limits, heart rate elevated earlier today morning at 132, rechecked and was WNL., Patient continues to take medications as scheduled.  Required trazodone 50 mg for sleep last night, Ativan 1 mg last night for agitation, and Hydroxyzine 25 mg earlier today for anxiety. Slept for 7 hours last night as per nursing flow sheets.  Has attended some group sessions in the past 24 hours, and was cooperative. 1: 1 staff monitoring discontinued earlier today morning, pt verbally contracted for safety on unit.  Patient assessment note (02/07/2022): Patient is seen during this encounter by writer and attending psychiatrist. Pt denies SI/HI/AVH today, and paranoia and have delusional thinking is to a lesser extent today.  Patient is able to allow attending psychiatrist to call his mother from his room, which he was not able to allow a couple of days ago.  He talks briefly about being concerned that his father will harm him, but then is able to rationalize that his father will not do anything to harm him.  He perseverates about wanting to be saved, briefly talks about having the Bible, and states that he saw prove a couple of days that God is real because 3 nurses walked into his  room.  During this encounter, patient's mother verbalized that she thinks patient is making an improvement, and that his mental status is stabilizing, and that his psychosis is resolving.  Patient's mother also reported that she has no safety concerns regarding patient returning to her home.  She was agreeable for patient to be discharged tomorrow 02/08/2022, to her home.  Patient's mother reported that the Zyprexa is causing patient to be tired, and attending psychiatrist offered options of keeping the patient longer and changing medications, all for patient to be discharged on current medications and follow up outpatient with titrations as needed.  Mother chose the latter, and stated that she would rather patient come home on current medications, and that she will follow up outpatient for medication titrations as necessary.  Plan is therefore to discharge patient tomorrow to mother's care, given that he has no behavioral outbursts between now and then. Pt reports a good sleep quality last night, reports a good appetite, denies any current medication related side effects, reports feeling a little tired, is attending unit group sessions and has been cooperative with care.  No TD/EPS type symptoms found on assessment, and pt denies any feelings of stiffness. AIMS: 0.  We are continuing all medications as listed below.  Total Time spent with patient: 45 minutes  Past Psychiatric History: See H & P  Past Medical History:  Past Medical History:  Diagnosis Date   Anxiety     Past Surgical History:  Procedure Laterality Date   NO PAST SURGERIES     Family History: History reviewed. No pertinent family history. Family Psychiatric  History: See H & P Social History:  Social History   Substance and Sexual Activity  Alcohol Use Yes   Comment: occassionally     Social History   Substance and Sexual Activity  Drug Use Yes   Types: Marijuana    Social History   Socioeconomic History   Marital  status: Single    Spouse name: Not on file   Number of children: Not on file   Years of education: Not on file   Highest education level: Not on file  Occupational History   Not on file  Tobacco Use   Smoking status: Never   Smokeless tobacco: Never  Substance and Sexual Activity   Alcohol use: Yes    Comment: occassionally   Drug use: Yes    Types: Marijuana   Sexual activity: Not Currently  Other Topics Concern   Not on file  Social History Narrative   Not on file   Social Determinants of Health   Financial Resource Strain: Not on file  Food Insecurity: No Food Insecurity (01/30/2022)   Hunger Vital Sign    Worried About Running Out of Food in the Last Year: Never true    Ran Out of Food in the Last Year: Never true  Transportation Needs: No Transportation Needs (01/30/2022)   PRAPARE - Administrator, Civil Service (Medical): No    Lack of Transportation (Non-Medical): No  Physical Activity: Not on file  Stress: Not on file  Social Connections: Not on file   Sleep: Good  Appetite:  Good  Current Medications: Current Facility-Administered Medications  Medication Dose Route Frequency Provider Last Rate Last Admin   acetaminophen (TYLENOL) tablet 650 mg  650 mg Oral Q6H PRN Onuoha, Chinwendu V, NP   650 mg at 02/04/22 1818   alum & mag hydroxide-simeth (MAALOX/MYLANTA) 200-200-20 MG/5ML suspension 30 mL  30 mL Oral Q4H PRN Onuoha, Chinwendu V, NP       benztropine (COGENTIN) tablet 1 mg  1 mg Oral Q12H Massengill, Nathan, MD   1 mg at 02/07/22 4098   benztropine mesylate (COGENTIN) injection 2 mg  2 mg Intramuscular BID PRN Massengill, Harrold Donath, MD       haloperidol (HALDOL) tablet 5 mg  5 mg Oral TID PRN Phineas Inches, MD   5 mg at 02/04/22 1529   And   LORazepam (ATIVAN) tablet 2 mg  2 mg Oral TID PRN Phineas Inches, MD   2 mg at 02/04/22 1818   And   diphenhydrAMINE (BENADRYL) capsule 50 mg  50 mg Oral TID PRN Phineas Inches, MD   50 mg at  02/04/22 0820   haloperidol lactate (HALDOL) injection 5 mg  5 mg Intramuscular TID PRN Massengill, Harrold Donath, MD       And   LORazepam (ATIVAN) injection 2 mg  2 mg Intramuscular TID PRN Massengill, Harrold Donath, MD       And   diphenhydrAMINE (BENADRYL) injection 50 mg  50 mg Intramuscular TID PRN Massengill, Harrold Donath, MD       docusate sodium (COLACE) capsule 100 mg  100 mg Oral BID Massengill, Harrold Donath, MD   100 mg at 02/07/22 0807   haloperidol (HALDOL) tablet 10 mg  10 mg Oral Q8H Massengill, Nathan, MD   10 mg at 02/07/22 1351   hydrOXYzine (ATARAX) tablet 25 mg  25 mg Oral TID PRN Onuoha, Chinwendu V, NP   25 mg at 02/07/22 1012   LORazepam (ATIVAN) tablet 1 mg  1 mg Oral  Q6H PRN Phineas Inches, MD   1 mg at 02/06/22 2136   magnesium hydroxide (MILK OF MAGNESIA) suspension 30 mL  30 mL Oral Daily PRN Onuoha, Chinwendu V, NP       OLANZapine (ZYPREXA) tablet 15 mg  15 mg Oral QHS Karie Fetch, MD   15 mg at 02/06/22 2039   propranolol (INDERAL) tablet 5 mg  5 mg Oral Q12H Massengill, Nathan, MD   5 mg at 02/07/22 0807   traZODone (DESYREL) tablet 50 mg  50 mg Oral QHS PRN Sindy Guadeloupe, NP   50 mg at 02/05/22 2035    Lab Results: No results found for this or any previous visit (from the past 48 hour(s)).  Blood Alcohol level:  Lab Results  Component Value Date   ETH <10 01/27/2022   ETH <10 07/31/2019    Metabolic Disorder Labs: No results found for: "HGBA1C", "MPG" No results found for: "PROLACTIN" No results found for: "CHOL", "TRIG", "HDL", "CHOLHDL", "VLDL", "LDLCALC"  Physical Findings: AIMS: Facial and Oral Movements Muscles of Facial Expression: None, normal Lips and Perioral Area: None, normal Jaw: None, normal Tongue: None, normal,Extremity Movements Upper (arms, wrists, hands, fingers): None, normal Lower (legs, knees, ankles, toes): None, normal, Trunk Movements Neck, shoulders, hips: None, normal, Overall Severity Severity of abnormal movements (highest score from  questions above): None, normal Incapacitation due to abnormal movements: None, normal Patient's awareness of abnormal movements (rate only patient's report): No Awareness, Dental Status Current problems with teeth and/or dentures?: No Does patient usually wear dentures?: No  CIWA:    COWS:     Musculoskeletal: Strength & Muscle Tone: within normal limits Gait & Station: normal Patient leans: N/A  Psychiatric Specialty Exam:  Presentation  General Appearance:  Appropriate for Environment; Fairly Groomed  Eye Contact: Good  Speech: Clear and Coherent  Speech Volume: Normal  Handedness: Right  Mood and Affect  Mood: Anxious  Affect: Congruent  Thought Process  Thought Processes: Coherent  Descriptions of Associations:Intact  Orientation:Full (Time, Place and Person)  Thought Content:Logical  History of Schizophrenia/Schizoaffective disorder:No data recorded Duration of Psychotic Symptoms:No data recorded Hallucinations:Hallucinations: None  Ideas of Reference:Paranoia  Suicidal Thoughts:Suicidal Thoughts: No  Homicidal Thoughts:Homicidal Thoughts: No  Sensorium  Memory: Immediate Good  Judgment: Fair  Insight: Fair  Art therapist  Concentration: Fair  Attention Span: Good  Recall: [employer] of Knowledge: Fair  Language: Fair  Psychomotor Activity  Psychomotor Activity:Psychomotor Activity: Normal  Assets  Assets: Manufacturing systems engineer; Housing; Social Support  Sleep  Sleep:Sleep: Good   Physical Exam: Physical Exam Constitutional:      Appearance: He is obese.  HENT:     Head: Normocephalic.     Nose: Nose normal. No congestion or rhinorrhea.  Eyes:     Pupils: Pupils are equal, round, and reactive to light.  Pulmonary:     Effort: Pulmonary effort is normal. No respiratory distress.  Musculoskeletal:        General: Normal range of motion.     Cervical back: Normal range of motion.  Neurological:      Mental Status: He is oriented to person, place, and time.    Review of Systems  Constitutional:  Negative for fever.  HENT: Negative.  Negative for hearing loss and sore throat.   Eyes:  Negative for blurred vision.  Respiratory:  Negative for cough.   Cardiovascular:  Negative for chest pain.  Gastrointestinal:  Negative for heartburn.  Genitourinary:  Negative for dysuria.  Musculoskeletal:  Negative for myalgias.  Skin: Negative.   Neurological: Negative.  Negative for dizziness.  Psychiatric/Behavioral:  Positive for depression and substance abuse. Negative for hallucinations (paranoia and delusional thinking), memory loss and suicidal ideas. The patient is nervous/anxious and has insomnia (improving).    Blood pressure 119/76, pulse 82, temperature 97.8 F (36.6 C), temperature source Oral, resp. rate 18, height 5\' 11"  (1.803 m), weight 104.3 kg, SpO2 100 %. Body mass index is 32.08 kg/m.  Treatment Plan Summary: Daily contact with patient to assess and evaluate symptoms and progress in treatment and Medication management  Observation Level/Precautions: Q15 minutes  Laboratory:  Labs reviewed   Psychotherapy:  Unit Group sessions  Medications:  See [facility]  Consultations:  To be determined   Discharge Concerns:  Safety, medication compliance, mood stability  Estimated LOS: 5-7 days  Other:  N/A   PLAN Safety and Monitoring: Involuntary admission to inpatient psychiatric unit for safety, stabilization and treatment Daily contact with patient to assess and evaluate symptoms and progress in treatment Patient's case to be discussed in multi-disciplinary team meeting Observation Level : Q15 minutes safety monitoring Vital signs: q12 hours Precautions: Safety  Long Term Goal(s): Improvement in symptoms so as ready for discharge  Short Term Goals: Ability to identify changes in lifestyle to reduce recurrence of condition will improve, Ability to verbalize feelings will improve,  Ability to disclose and discuss suicidal ideas, Ability to demonstrate self-control will improve, Ability to identify and develop effective coping behaviors will improve, Compliance with prescribed medications will improve, and Ability to identify triggers associated with substance abuse/mental health issues will improve  Diagnoses:  Principal Problem:   Brief psychotic disorder (HCC) Active Problems:   Anxiety state   Laceration of left knee   Insomnia  Medications -Continue Haldol to 10 mg q8 hours for psychosis, agitation (Repeat EKG ordered to trend Qtc with increase in dose of antipsychotic. Last Qtc on 11/03 with QTC of 429. Lipid panel and Hemoglobin A1C ordered and pending). -Continue benztropine 1 mg q12 hours for EPS prophylaxis while on a first generation antipsychotic -Continue Zyprexa 15mg  QHS for psychosis -Continue Hydroxyzine 25 mg TID PRN for anxiety -Continue Ativan Q 6 H PRN for anxiety -Continue Trazodone, 50 mg nightly PRN for sleep -Continue Agitation Protocol (Haldol 5 mg/ Ativan 2 mg/Benadryl 50 mg PO/IM TID PRN) -Continue Colace 100 mg daily for constipation  Other PRNS -Continue Tylenol 650 mg every 6 hours PRN for mild pain -Continue Maalox 30 mg every 4 hrs PRN for indigestion -Continue Milk of Magnesia as needed every 6 hrs for constipation  Discharge Planning: Social work and case management to assist with discharge planning and identification of hospital follow-up needs prior to discharge Estimated LOS: 5-7 days Discharge Concerns: Need to establish a safety plan; Medication compliance and effectiveness Discharge Goals: Return home with outpatient referrals for mental health follow-up including medication management/psychotherapy  I certify that inpatient services furnished can reasonably be expected to improve the patient's condition.    13/03, NP 11/8/20234:24 PM  Patient ID: Benjamin Werner, male   DOB: [birth year], 21 y.o.   MRN:  03/03/2001 Patient ID: Benjamin Werner, male   DOB: 15-Aug-2000, 21 y.o.   MRN: 03/03/2001

## 2022-02-07 NOTE — Progress Notes (Signed)
1:1 Note  Patient asleep in room, respirations are even and unlabored. No distress noted. Safety maintained with 1:1 observation level, assigned staff in attendance.

## 2022-02-07 NOTE — Progress Notes (Signed)
[facility] Post 1:1 Observation Documentation  For the first (8) hours following discontinuation of 1:1 precautions, a progress note entry by nursing staff should be documented at least every 2 hours, reflecting the patient's behavior, condition, mood, and conversation.  Use the progress notes for additional entries.  Time 1:1 discontinued:  0959  Patient's Behavior:  intrusive, paranoid, fidgety  Patient's Condition:  fidgety, restless  Patient's Conversation:  Pt currently demanding to have a therapist "I need a therapist to just talk to. I know you asked me about any past trauma but nothing. That's why I need that sitter person back because they were helping. I just need someone here, a therapist just for me right now". Pt continues to need frequent verbal redirections for intrusive behavior related to his paranoia.         Ouida Sills, Lincoln Maxin 02/07/2022, 1600

## 2022-02-07 NOTE — Progress Notes (Signed)
   02/07/22 0536  Sleep  Number of Hours 7

## 2022-02-07 NOTE — BH IP Treatment Plan (Signed)
Interdisciplinary Treatment and Diagnostic Plan Update  02/07/2022 Time of Session: 0830  Benjamin Werner MRN: [id number]  Principal Diagnosis: Brief psychotic disorder [facility])  Secondary Diagnoses: Principal Problem:   Brief psychotic disorder (HCC) Active Problems:   Anxiety state   Laceration of left knee   Insomnia   Current Medications:  Current Facility-Administered Medications  Medication Dose Route Frequency Provider Last Rate Last Admin   acetaminophen (TYLENOL) tablet 650 mg  650 mg Oral Q6H PRN Onuoha, Chinwendu V, NP   650 mg at 02/04/22 1818   alum & mag hydroxide-simeth (MAALOX/MYLANTA) 200-200-20 MG/5ML suspension 30 mL  30 mL Oral Q4H PRN Onuoha, Chinwendu V, NP       benztropine (COGENTIN) tablet 1 mg  1 mg Oral Q12H Massengill, Nathan, MD   1 mg at 02/07/22 2048   benztropine mesylate (COGENTIN) injection 2 mg  2 mg Intramuscular BID PRN Massengill, Harrold Donath, MD       haloperidol (HALDOL) tablet 5 mg  5 mg Oral TID PRN Phineas Inches, MD   5 mg at 02/04/22 1529   And   LORazepam (ATIVAN) tablet 2 mg  2 mg Oral TID PRN Phineas Inches, MD   2 mg at 02/04/22 1818   And   diphenhydrAMINE (BENADRYL) capsule 50 mg  50 mg Oral TID PRN Phineas Inches, MD   50 mg at 02/04/22 0820   haloperidol lactate (HALDOL) injection 5 mg  5 mg Intramuscular TID PRN Massengill, Harrold Donath, MD       And   LORazepam (ATIVAN) injection 2 mg  2 mg Intramuscular TID PRN Massengill, Harrold Donath, MD       And   diphenhydrAMINE (BENADRYL) injection 50 mg  50 mg Intramuscular TID PRN Massengill, Harrold Donath, MD       docusate sodium (COLACE) capsule 100 mg  100 mg Oral BID Massengill, Harrold Donath, MD   100 mg at 02/07/22 1626   haloperidol (HALDOL) tablet 10 mg  10 mg Oral Q8H Massengill, Nathan, MD   10 mg at 02/07/22 2049   hydrOXYzine (ATARAX) tablet 25 mg  25 mg Oral TID PRN Onuoha, Chinwendu V, NP   25 mg at 02/07/22 2048   LORazepam (ATIVAN) tablet 1 mg  1 mg Oral Q6H PRN Massengill, Harrold Donath, MD   1 mg  at 02/07/22 2209   magnesium hydroxide (MILK OF MAGNESIA) suspension 30 mL  30 mL Oral Daily PRN Onuoha, Chinwendu V, NP       OLANZapine (ZYPREXA) tablet 15 mg  15 mg Oral QHS Karie Fetch, MD   15 mg at 02/07/22 2050   traZODone (DESYREL) tablet 50 mg  50 mg Oral QHS PRN Sindy Guadeloupe, NP   50 mg at 02/07/22 2048   PTA Medications: No medications prior to admission.    Patient Stressors: Medication change or noncompliance   Traumatic event    Patient Strengths: Ability for insight  Communication skills  Supportive family/friends   Treatment Modalities: Medication Management, Group therapy, Case management,  1 to 1 session with clinician, Psychoeducation, Recreational therapy.   Physician Treatment Plan for Primary Diagnosis: Brief psychotic disorder (HCC) Long Term Goal(s): Improvement in symptoms so as ready for discharge   Short Term Goals: Ability to identify changes in lifestyle to reduce recurrence of condition will improve Ability to verbalize feelings will improve Ability to disclose and discuss suicidal ideas Ability to demonstrate self-control will improve Ability to identify and develop effective coping behaviors will improve Compliance with prescribed medications will improve Ability to identify  triggers associated with substance abuse/mental health issues will improve  Medication Management: Evaluate patient's response, side effects, and tolerance of medication regimen.  Therapeutic Interventions: 1 to 1 sessions, Unit Group sessions and Medication administration.  Evaluation of Outcomes: Progressing  Physician Treatment Plan for Secondary Diagnosis: Principal Problem:   Brief psychotic disorder (HCC) Active Problems:   Anxiety state   Laceration of left knee   Insomnia  Long Term Goal(s): Improvement in symptoms so as ready for discharge   Short Term Goals: Ability to identify changes in lifestyle to reduce recurrence of condition will improve Ability  to verbalize feelings will improve Ability to disclose and discuss suicidal ideas Ability to demonstrate self-control will improve Ability to identify and develop effective coping behaviors will improve Compliance with prescribed medications will improve Ability to identify triggers associated with substance abuse/mental health issues will improve     Medication Management: Evaluate patient's response, side effects, and tolerance of medication regimen.  Therapeutic Interventions: 1 to 1 sessions, Unit Group sessions and Medication administration.  Evaluation of Outcomes: Progressing   RN Treatment Plan for Primary Diagnosis: Brief psychotic disorder (HCC) Long Term Goal(s): Knowledge of disease and therapeutic regimen to maintain health will improve  Short Term Goals: Ability to remain free from injury will improve, Ability to verbalize frustration and anger appropriately will improve, Ability to demonstrate self-control, Ability to participate in decision making will improve, Ability to verbalize feelings will improve, Ability to disclose and discuss suicidal ideas, Ability to identify and develop effective coping behaviors will improve, and Compliance with prescribed medications will improve  Medication Management: RN will administer medications as ordered by provider, will assess and evaluate patient's response and provide education to patient for prescribed medication. RN will report any adverse and/or side effects to prescribing provider.  Therapeutic Interventions: 1 on 1 counseling sessions, Psychoeducation, Medication administration, Evaluate responses to treatment, Monitor vital signs and CBGs as ordered, Perform/monitor CIWA, COWS, AIMS and Fall Risk screenings as ordered, Perform wound care treatments as ordered.  Evaluation of Outcomes: Progressing   LCSW Treatment Plan for Primary Diagnosis: Brief psychotic disorder [employer]) Long Term Goal(s): Safe transition to appropriate next  level of care at discharge, Engage patient in therapeutic group addressing interpersonal concerns.  Short Term Goals: Engage patient in aftercare planning with referrals and resources, Increase social support, Increase ability to appropriately verbalize feelings, Increase emotional regulation, Facilitate acceptance of mental health diagnosis and concerns, Facilitate patient progression through stages of change regarding substance use diagnoses and concerns, Identify triggers associated with mental health/substance abuse issues, and Increase skills for wellness and recovery  Therapeutic Interventions: Assess for all discharge needs, 1 to 1 time with Social worker, Explore available resources and support systems, Assess for adequacy in community support network, Educate family and significant other(s) on suicide prevention, Complete Psychosocial Assessment, Interpersonal group therapy.  Evaluation of Outcomes: Progressing   Progress in Treatment: Attending groups: Yes. Participating in groups: Yes. Taking medication as prescribed: Yes. Toleration medication: Yes. Family/Significant other contact made: Yes, individual(s) contacted:  [name] (mother) [phone] Patient understands diagnosis: No. Discussing patient identified problems/goals with staff: Yes. Medical problems stabilized or resolved: Yes. Denies suicidal/homicidal ideation: No. Issues/concerns per patient self-inventory: Yes. Other: none   New problem(s) identified: No, Describe:  none  New Short Term/Long Term Goal(s): Patient to work towards elimination of symptoms of psychosis, medication management for mood stabilization; elimination of SI thoughts; development of comprehensive mental wellness plan.  Patient Goals:  No additional goals identified at  this time. Patient to continue to work towards original goals identified in initial treatment team meeting. CSW will remain available to patient should they voice  additional treatment goals.   Discharge Plan or Barriers: No psychosocial barriers identified at this time, patient to return to place of residence when appropriate for discharge.   Reason for Continuation of Hospitalization: Other; describe psychosis   Estimated Length of Stay: 1-7 days   Scribe for Treatment Team: Almedia Balls 02/07/2022 11:16 PM

## 2022-02-08 DIAGNOSIS — F200 Paranoid schizophrenia: Secondary | ICD-10-CM | POA: Diagnosis not present

## 2022-02-08 MED ORDER — WHITE PETROLATUM EX OINT
TOPICAL_OINTMENT | CUTANEOUS | Status: AC
  Filled 2022-02-08: qty 5

## 2022-02-08 MED ORDER — HYDROXYZINE HCL 25 MG PO TABS: 25.0000 mg | ORAL_TABLET | Freq: Three times a day (TID) | ORAL | 0 refills | Status: AC | PRN

## 2022-02-08 MED ORDER — OLANZAPINE 15 MG PO TABS: 15.0000 mg | ORAL_TABLET | Freq: Every day | ORAL | 0 refills | Status: AC

## 2022-02-08 MED ORDER — TRAZODONE HCL 50 MG PO TABS: 50.0000 mg | ORAL_TABLET | Freq: Every evening | ORAL | 0 refills | Status: AC | PRN

## 2022-02-08 MED ORDER — BENZTROPINE MESYLATE 1 MG PO TABS: 1.0000 mg | ORAL_TABLET | Freq: Two times a day (BID) | ORAL | 0 refills | Status: AC

## 2022-02-08 MED ORDER — HALOPERIDOL 10 MG PO TABS: 10.0000 mg | ORAL_TABLET | Freq: Three times a day (TID) | ORAL | 0 refills | Status: DC

## 2022-02-08 NOTE — Group Note (Signed)
Recreation Therapy Group Note   Group Topic:Other  Group Date: 02/08/2022 Start Time: 0955 End Time: 1040 Facilitators: Zeina Akkerman-McCall, LRT,CTRS Location: [address] Day[address]   Goal Area(s) Addresses:  Patient will use appropriate dances as a way of self expression.   Patient will use dance as a way to release stress.   Group Description: Music Therapy.  LRT and patients talked about the importance of using music as a way to express one's self and the benefits.  Patients were allowed to pick songs that interested them and dance along to them.  Patients were to choose songs that were clean and appropriate.     Affect/Mood: Flat   Participation Level: None   Participation Quality: N/A   Behavior: Distracted   Speech/Thought Process: Distracted   Insight: Poor   Judgement: Poor   Modes of Intervention: Music   Patient Response to Interventions:  Disengaged   Education Outcome:  Acknowledges education and In group clarification offered    Clinical Observations/Individualized Feedback: Pt wasn't engaged in group.  Pt was more focused on discharging and what would happen to any of the papers he signed.  Pt seemed a bit paranoid about what would happen to those forms and who would be allowed to see them.  Peers reassured pt no one would be able to medical records without his permission.  Pt would continue to repeat the same concerns.     Plan: Continue to engage patient in RT group sessions 2-3x/week.   Teale Goodgame-McCall, LRT,CTRS  02/08/2022 12:15 PM

## 2022-02-08 NOTE — Discharge Summary (Addendum)
Physician Discharge Summary Note  Patient:  Benjamin Werner is an 21 y.o., male MRN:  [id number] DOB:  Sep 30, 2000 Patient phone:  [phone] (home)  Patient address:   [address] [place] [address],  Total Time spent with patient: 20 minutes  Date of Admission:  01/30/2022 Date of Discharge: 02-08-2022  Reason for Admission:    Benjamin Werner is a 21 y.o., male with significant past psychiatric history of unspecified depression who presents to the [facility] Voluntary from  [facility] Emergency Department for evaluation and management of gross disorganization and bizarre behavior, self reported anxiety and panic attack, and reported suicidal statement in the setting of cannabis use 1 week ago (admitted on 01/30/2022).   On day of admission, pt had agitated event and STARR. Pt had to be transferred to ED for medical clearance. Pt was transferred back to [facility] for remained of hospitalization.   During admission, psychosis and paranoia were very prominent. Pt had prodromal symptoms for many months and he was ultimately diagnosed with schizophrenia. This is not brief psychotic d/o.   Principal Problem: Paranoid schizophrenia [facility]) Discharge Diagnoses: Principal Problem:   Paranoid schizophrenia (HCC) Active Problems:   Laceration of left knee   Past Psychiatric History:  Previous Psych Diagnoses: depression, suicidal thoughts Prior inpatient psychiatric treatment: denies Current/prior outpatient psychiatric treatment: denies Current psychiatrist: denies   Psychiatric medication history: denies   Psychiatric medication compliance history: N/A Neuromodulation history: denies   Current therapist: denies Psychotherapy hx: in high school, spoke to a therapist for depression but cannot recall more details   History of suicide attempts: x1 as a child, drank bleach History of homicide: denies   Last menstrual period (if applicable): N/A Contraceptives: N/A  Past  Medical History:  Past Medical History:  Diagnosis Date   Anxiety     Past Surgical History:  Procedure Laterality Date   NO PAST SURGERIES     Family History: History reviewed. No pertinent family history.  Family Psychiatric  History:  Medical: denies Psych: 2 uncles on mom's side have schizophrenia Psych Rx: unknown Suicide: unknown Homicide: unknown Substance use family hx: father is alcoholic     Social History:  Social History   Substance and Sexual Activity  Alcohol Use Yes   Comment: occassionally     Social History   Substance and Sexual Activity  Drug Use Yes   Types: Marijuana    Social History   Socioeconomic History   Marital status: Single    Spouse name: Not on file   Number of children: Not on file   Years of education: Not on file   Highest education level: Not on file  Occupational History   Not on file  Tobacco Use   Smoking status: Never   Smokeless tobacco: Never  Substance and Sexual Activity   Alcohol use: Yes    Comment: occassionally   Drug use: Yes    Types: Marijuana   Sexual activity: Not Currently  Other Topics Concern   Not on file  Social History Narrative   Not on file   Social Determinants of Health   Financial Resource Strain: Not on file  Food Insecurity: No Food Insecurity (01/30/2022)   Hunger Vital Sign    Worried About Running Out of Food in the Last Year: Never true    Ran Out of Food in the Last Year: Never true  Transportation Needs: No Transportation Needs (01/30/2022)   PRAPARE - Transportation    Lack of Transportation (  Medical): No    Lack of Transportation (Non-Medical): No  Physical Activity: Not on file  Stress: Not on file  Social Connections: Not on file    Hospital Course:    During the patient's hospitalization, patient had extensive initial psychiatric evaluation, and follow-up psychiatric evaluations every day.   Psychiatric diagnoses during admission: -schizophrenia    Patient's  psychiatric medications were adjusted on admission:  -stop zyprexa (started by admitting provider) -start geodon 20 mg bid with meals   During the hospitalization, other adjustments were made to the patient's psychiatric medication regimen:  -geodon was dc due to extreme agitation and STARR event and haldol was started -haldol was started and increased to 10 mg tid -zyprexa was started and increased to 15 mg qhs There was consideration for clozapine but collateral (mother) thinks pt is better and approaching baseline. There is paranoia that persists. He might require clozapine in the future.  -he declined LAI    Patient's care was discussed during the interdisciplinary team meeting every day during the hospitalization. On day of admission, there was agitation and STARR event, pt was transferred to ED for medical clearance, and returned back to [facility]. He required 1:1 sitter until day before discharge.    The patient reported having fatigue, he otherwise denied having side effects to prescribed psychiatric medication.   Gradually, patient started adjusting to milieu. The patient was evaluated each day by a clinical provider to ascertain response to treatment. Improvement was noted by the patient's report of decreasing symptoms, improved sleep and appetite, affect, medication tolerance, behavior, and participation in unit programming.  Patient was asked each day to complete a self inventory noting mood, mental status, pain, new symptoms, anxiety and concerns.     Symptoms were reported as significantly decreased or resolved completely by discharge.    On day of discharge, the patient reports that their mood is stable. The patient denied having suicidal thoughts for more than 48 hours prior to discharge.  Patient denies having homicidal thoughts.  Patient denies having auditory hallucinations.  Patient denies any visual hallucinations. The patient was motivated to continue taking medication with a goal  of continued improvement in mental health.    The patient reports their target psychiatric symptoms of psychosis and paranoia responded well to the psychiatric medications, and the patient reports overall benefit other psychiatric hospitalization. There is residual paranoia but collateral reports this is baseline. Pt is at level that the paranoia can be managed as an outpatient.  Supportive psychotherapy was provided to the patient. The patient also participated in regular group therapy while hospitalized. Coping skills, problem solving as well as relaxation therapies were also part of the unit programming.   Labs were reviewed with the patient, and abnormal results were discussed with the patient.   The patient is able to verbalize their individual safety plan to this provider.   # It is recommended to the patient to continue psychiatric medications as prescribed, after discharge from the hospital.     # It is recommended to the patient to follow up with your outpatient psychiatric provider and PCP.   # It was discussed with the patient, the impact of alcohol, drugs, tobacco have been there overall psychiatric and medical wellbeing, and total abstinence from substance use was recommended the patient.ed.   # Prescriptions provided or sent directly to preferred pharmacy at discharge. Patient agreeable to plan. Given opportunity to ask questions. Appears to feel comfortable with discharge.    # In the  event of worsening symptoms, the patient is instructed to call the crisis hotline, 911 and or go to the nearest ED for appropriate evaluation and treatment of symptoms. To follow-up with primary care provider for other medical issues, concerns and or health care needs   # Patient was discharged home with mother with a plan to follow up as noted below.    Aims score zero on my exam. No eps on my exam.    Physical Findings: AIMS: Facial and Oral Movements Muscles of Facial Expression: None,  normal Lips and Perioral Area: None, normal Jaw: None, normal Tongue: None, normal,Extremity Movements Upper (arms, wrists, hands, fingers): None, normal Lower (legs, knees, ankles, toes): None, normal, Trunk Movements Neck, shoulders, hips: None, normal, Overall Severity Severity of abnormal movements (highest score from questions above): None, normal Incapacitation due to abnormal movements: None, normal Patient's awareness of abnormal movements (rate only patient's report): No Awareness, Dental Status Current problems with teeth and/or dentures?: No Does patient usually wear dentures?: No  CIWA:    COWS:     Musculoskeletal: Strength & Muscle Tone: within normal limits Gait & Station: normal Patient leans: N/A  Aims score zero on my exam. No eps on my exam.  Psychiatric Specialty Exam:  Presentation  General Appearance:  Casual  Eye Contact: Good  Speech: Normal Rate  Speech Volume: Decreased  Handedness: Right   Mood and Affect  Mood: Anxious  Affect: Congruent; Restricted   Thought Process  Thought Processes: Linear  Descriptions of Associations:Intact  Orientation:Full (Time, Place and Person)  Thought Content:Paranoid Ideation  History of Schizophrenia/Schizoaffective disorder:No  Duration of Psychotic Symptoms:Less than six months  Hallucinations:Hallucinations: None  Ideas of Reference:Paranoia  Suicidal Thoughts:Suicidal Thoughts: No  Homicidal Thoughts:Homicidal Thoughts: No   Sensorium  Memory: Recent Good; Remote Good; Immediate Good  Judgment: Fair  Insight: Fair   Chartered certified accountant: Fair  Attention Span: Fair  Recall: [employer] of Knowledge: Fair  Language: Fair   Psychomotor Activity  Psychomotor Activity: Psychomotor Activity: Normal   Assets  Assets: Manufacturing systems engineer; Housing; Social Support   Sleep  Sleep: Sleep: Fair    Physical Exam: Physical Exam Vitals  reviewed.    Review of Systems  Constitutional:  Negative for chills and fever.  Cardiovascular:  Negative for chest pain and palpitations.  Neurological:  Negative for dizziness, tingling, tremors and headaches.  Psychiatric/Behavioral:  Negative for depression, hallucinations, memory loss, substance abuse and suicidal ideas. The patient is not nervous/anxious and does not have insomnia.   All other systems reviewed and are negative.  Blood pressure 104/66, pulse (!) 140, temperature 98.7 F (37.1 C), temperature source Oral, resp. rate 18, height 5\' 11"  (1.803 m), weight 104.3 kg, SpO2 94 %. Body mass index is 32.08 kg/m.   Social History   Tobacco Use  Smoking Status Never  Smokeless Tobacco Never   Tobacco Cessation:  N/A, patient does not currently use tobacco products   Blood Alcohol level:  Lab Results  Component Value Date   ETH <10 01/27/2022   ETH <10 07/31/2019    Metabolic Disorder Labs:  Lab Results  Component Value Date   HGBA1C 4.9 02/07/2022   MPG 93.93 02/07/2022   No results found for: "PROLACTIN" Lab Results  Component Value Date   CHOL 93 02/07/2022   TRIG 109 02/07/2022   HDL 29 (L) 02/07/2022   CHOLHDL 3.2 02/07/2022   VLDL 22 02/07/2022   LDLCALC 42 02/07/2022    See Psychiatric  Specialty Exam and Suicide Risk Assessment completed by Attending Physician prior to discharge.  Discharge destination:  Home  Is patient on multiple antipsychotic therapies at discharge:  Yes,   Do you recommend tapering to monotherapy for antipsychotics?  Yes   Has Patient had three or more failed trials of antipsychotic monotherapy by history:  Yes,   Antipsychotic medications that previously failed include:   1.  geodon., 2.  Lower dose of zyprexa (5 mg once daily)., and 3.  Lower doses of haldol (10 mg bid). Pt still has paranoia with 2 antipsychotics. At level that he can f/u as outpatient but might require clozapine in the future.   Recommended Plan for  Multiple Antipsychotic Therapies: Additional reason(s) for multiple antispychotic treatment:  pt agitated, at starr, significant paranoia that only partially responded to 2 antipsychotics. He might require clozapine in the future.   Discharge Instructions     Diet - low sodium heart healthy   Complete by: As directed    Increase activity slowly   Complete by: As directed    Remove dressing in 24 hours   Complete by: As directed       Allergies as of 02/08/2022       Reactions   Cat Hair Extract    Dog Epithelium    Eggs Or Egg-derived Products    Penicillins Nausea And Vomiting        Medication List     TAKE these medications      Indication  benztropine 1 MG tablet Commonly known as: COGENTIN Take 1 tablet (1 mg total) by mouth every 12 (twelve) hours.  Indication: Extrapyramidal Reaction caused by Medications   haloperidol 10 MG tablet Commonly known as: HALDOL Take 1 tablet (10 mg total) by mouth every 8 (eight) hours.  Indication: Psychosis   hydrOXYzine 25 MG tablet Commonly known as: ATARAX Take 1 tablet (25 mg total) by mouth 3 (three) times daily as needed for anxiety.  Indication: Feeling Anxious   OLANZapine 15 MG tablet Commonly known as: ZYPREXA Take 1 tablet (15 mg total) by mouth at bedtime.  Indication: Schizophrenia   traZODone 50 MG tablet Commonly known as: DESYREL Take 1 tablet (50 mg total) by mouth at bedtime as needed for sleep.  Indication: Trouble Sleeping        Follow-up Information     [facility] Go on 02/09/2022.   Specialty: Behavioral Health Why: You have an appointment on 02/09/22 at 11 am with Dr. Leone Havenoda for medication management services.  You also have an appointment with Tiarra for therapy services on 02/20/22 @ 9:30am.  These appointments will be held in person. Contact information: [phone] [place] [id number] [phone]                Follow-up  recommendations:     Activity: as tolerated   Diet: heart healthy   Other: -Follow-up with your outpatient psychiatric provider -instructions on appointment date, time, and address (location) are provided to you in discharge paperwork.   -Take your psychiatric medications as prescribed at discharge - instructions are provided to you in the discharge paperwork   -Follow-up with outpatient primary care doctor and other specialists -for management of preventative medicine and chronic medical disease.   -Recommend abstinence from alcohol, tobacco, and other illicit drug use at discharge.    -If your psychiatric symptoms recur, worsen, or if you have side effects to your psychiatric medications, call your outpatient psychiatric provider,  [phone] or go to the nearest emergency department.   -If suicidal thoughts recur, call your outpatient psychiatric provider, 911, [phone] or go to the nearest emergency department.     Signed: Cristy Hilts, MD 02/08/2022, 9:41 AM

## 2022-02-08 NOTE — Progress Notes (Signed)
   02/07/22 2048  Psych Admission Type (Psych Patients Only)  Admission Status Involuntary  Psychosocial Assessment  Patient Complaints Anxiety  Eye Contact Fair  Facial Expression Anxious  Affect Anxious  Speech Logical/coherent  Interaction Needy  Motor Activity Slow  Appearance/Hygiene Improved  Behavior Characteristics Cooperative  Mood Anxious  Thought Process  Coherency Circumstantial  Content Religiosity  Delusions Religious  Perception WDL  Hallucination None reported or observed  Judgment Poor  Confusion None  Danger to Self  Current suicidal ideation? Denies  Danger to Others  Danger to Others None reported or observed

## 2022-02-08 NOTE — [facility] Suicide Risk Assessment (Signed)
[facility] Discharge Suicide Risk Assessment   Principal Problem: Paranoid schizophrenia [facility]) Discharge Diagnoses: Principal Problem:   Paranoid schizophrenia (HCC) Active Problems:   Laceration of left knee   Total Time spent with patient: 20 minutes  Benjamin Werner is a 21 y.o., male with significant past psychiatric history of unspecified depression who presents to the [facility] Voluntary from  [facility] Emergency Department for evaluation and management of gross disorganization and bizarre behavior, self reported anxiety and panic attack, and reported suicidal statement in the setting of cannabis use 1 week ago (admitted on 01/30/2022).   HOSPITAL COURSE:  During the patient's hospitalization, patient had extensive initial psychiatric evaluation, and follow-up psychiatric evaluations every day.  Psychiatric diagnoses during admission: -schizophrenia   Patient's psychiatric medications were adjusted on admission:  -stop zyprexa (started by admitting provider) -start geodon 20 mg bid with meals  During the hospitalization, other adjustments were made to the patient's psychiatric medication regimen:  -geodon was dc due to extreme agitation and STARR event and haldol was started -haldol was started and increased to 10 mg tid -zyprexa was started and increased to 15 mg qhs There was consideration for clozapine but collateral (mother) thinks pt is better and approaching baseline. There is paranoia that persists. He might require clozapine in the future.   Patient's care was discussed during the interdisciplinary team meeting every day during the hospitalization. On day of admission, there was agitation and STARR event, pt was transferred to ED for medical clearance, and returned back to [facility]. He required 1:1 sitter until day before discharge.   The patient reported having fatigue, he otherwise denied having side effects to prescribed psychiatric medication.  Gradually,  patient started adjusting to milieu. The patient was evaluated each day by a clinical provider to ascertain response to treatment. Improvement was noted by the patient's report of decreasing symptoms, improved sleep and appetite, affect, medication tolerance, behavior, and participation in unit programming.  Patient was asked each day to complete a self inventory noting mood, mental status, pain, new symptoms, anxiety and concerns.    Symptoms were reported as significantly decreased or resolved completely by discharge.   On day of discharge, the patient reports that their mood is stable. The patient denied having suicidal thoughts for more than 48 hours prior to discharge.  Patient denies having homicidal thoughts.  Patient denies having auditory hallucinations.  Patient denies any visual hallucinations. The patient was motivated to continue taking medication with a goal of continued improvement in mental health.   The patient reports their target psychiatric symptoms of psychosis and paranoia responded well to the psychiatric medications, and the patient reports overall benefit other psychiatric hospitalization. There is residual paranoia but collateral reports this is baseline. Pt is at level that the paranoia can be managed as an outpatient.  Supportive psychotherapy was provided to the patient. The patient also participated in regular group therapy while hospitalized. Coping skills, problem solving as well as relaxation therapies were also part of the unit programming.  Labs were reviewed with the patient, and abnormal results were discussed with the patient.  The patient is able to verbalize their individual safety plan to this provider.  # It is recommended to the patient to continue psychiatric medications as prescribed, after discharge from the hospital.    # It is recommended to the patient to follow up with your outpatient psychiatric provider and PCP.  # It was discussed with the patient,  the impact of alcohol, drugs, tobacco  have been there overall psychiatric and medical wellbeing, and total abstinence from substance use was recommended the patient.ed.  # Prescriptions provided or sent directly to preferred pharmacy at discharge. Patient agreeable to plan. Given opportunity to ask questions. Appears to feel comfortable with discharge.    # In the event of worsening symptoms, the patient is instructed to call the crisis hotline, 911 and or go to the nearest ED for appropriate evaluation and treatment of symptoms. To follow-up with primary care provider for other medical issues, concerns and or health care needs  # Patient was discharged home with mother with a plan to follow up as noted below.   Aims score zero on my exam. No eps on my exam.    Psychiatric Specialty Exam  Presentation  General Appearance:  Casual  Eye Contact: Good  Speech: Normal Rate  Speech Volume: Decreased  Handedness: Right   Mood and Affect  Mood: Anxious  Duration of Depression Symptoms: No data recorded Affect: Congruent; Restricted   Thought Process  Thought Processes: Linear  Descriptions of Associations:Intact  Orientation:Full (Time, Place and Person)  Thought Content:Paranoid Ideation  History of Schizophrenia/Schizoaffective disorder:No  Duration of Psychotic Symptoms:Less than six months  Hallucinations:Hallucinations: None  Ideas of Reference:Paranoia  Suicidal Thoughts:Suicidal Thoughts: No  Homicidal Thoughts:Homicidal Thoughts: No   Sensorium  Memory: Recent Good; Remote Good; Immediate Good  Judgment: Fair  Insight: Fair   Chartered certified accountant: Fair  Attention Span: Fair  Recall: [employer] of Knowledge: Fair  Language: Fair   Psychomotor Activity  Psychomotor Activity: Psychomotor Activity: Normal   Assets  Assets: Manufacturing systems engineer; Housing; Social Support   Sleep  Sleep: Sleep:  Fair   Physical Exam: Physical Exam See discharge summary  ROS See discharge summary  Blood pressure 104/66, pulse (!) 140, temperature 98.7 F (37.1 C), temperature source Oral, resp. rate 18, height 5\' 11"  (1.803 m), weight 104.3 kg, SpO2 94 %. Body mass index is 32.08 kg/m.  Mental Status Per Nursing Assessment::   On Admission:  Self-harm thoughts  Demographic factors:  Male, Unemployed  Loss Factors:  Financial problems / change in socioeconomic status Historical Factors:  Prior suicide attempts Risk Reduction Factors:  Positive social support, Living with another person, especially a relative  Continued Clinical Symptoms:  Schizophrenia - paranoia persists at a lower level. Denying AH, VH. Denying other psychotic symptoms. Anxiety less. Denying SI, HI.   Cognitive Features That Contribute To Risk:  None    Suicide Risk:  Mild:  There are no identifiable suicide plans, no associated intent, mild dysphoria and related symptoms, good self-control (both objective and subjective assessment), few other risk factors, and identifiable protective factors, including available and accessible social support.    Follow-up Information     [facility]. Go on 02/09/2022.   Specialty: Behavioral Health Why: You have an appointment on 02/09/22 at 11 am with Dr. 13/10/23 for medication management services.  You also have an appointment with Tiarra for therapy services on 02/20/22 @ 9:30am.  These appointments will be held in person. Contact information: [address] [place] [phone]                Plan Of Care/Follow-up recommendations:   Activity: as tolerated  Diet: heart healthy  Other: -Follow-up with your outpatient psychiatric provider -instructions on appointment date, time, and address (location) are provided to you in discharge paperwork.  -Take your psychiatric medications as prescribed at discharge -  instructions are provided to you in the discharge paperwork  -Follow-up with outpatient primary care doctor and other specialists -for management of preventative medicine and chronic medical disease.  -Recommend abstinence from alcohol, tobacco, and other illicit drug use at discharge.   -If your psychiatric symptoms recur, worsen, or if you have side effects to your psychiatric medications, call your outpatient psychiatric provider, 911, 988 or go to the nearest emergency department.  -If suicidal thoughts recur, call your outpatient psychiatric provider, 911, 988 or go to the nearest emergency department.   Cristy Hilts, MD 02/08/2022, 9:35 AM  Total Time Spent in Direct Patient Care:  I personally spent 35 minutes on the unit in direct patient care. The direct patient care time included face-to-face time with the patient, reviewing the patient's chart, communicating with other professionals, and coordinating care. Greater than 50% of this time was spent in counseling or coordinating care with the patient regarding goals of hospitalization, psycho-education, and discharge planning needs.   Phineas Inches, MD Psychiatrist

## 2022-02-08 NOTE — Progress Notes (Signed)
  [facility] Adult Case Management Discharge Plan :  Will you be returning to the same living situation after discharge:  Yes,  back to stay with his mother at his home At discharge, do you have transportation home?: Yes,  mom will pick patient up Do you have the ability to pay for your medications: Yes,  insurance  Release of information consent forms completed and in the chart;  Patient's signature needed at discharge.  Patient to Follow up at:  Follow-up Information     [facility]. Go on 02/09/2022.   Specialty: Behavioral Health Why: You have an appointment on 02/09/22 at 11 am with Dr. Leone Haven for medication management services.  You also have an appointment with Tiarra for therapy services on 02/20/22 @ 9:30am.  These appointments will be held in person. Contact information: [phone] [place] [address] [phone]                Next level of care provider has access to [facility] Link:yes  Safety Planning and Suicide Prevention discussed: Yes,  mother, Benjamin Werner     Has patient been referred to the Quitline?: N/A patient is not a smoker  Patient has been referred for addiction treatment: N/A  Benjamin Werner E Benjamin Pannone, LCSW 02/08/2022, 10:05 AM

## 2022-02-08 NOTE — Progress Notes (Signed)
Patient discharged. Reviewed discharge instructions. Patient verbalized understanding. Patient received all personal belongings including paper prescriptions. Patient left unit at 1133.

## 2022-02-08 NOTE — Progress Notes (Signed)
   02/08/22 0900  Psych Admission Type (Psych Patients Only)  Admission Status Involuntary  Psychosocial Assessment  Patient Complaints Anxiety  Eye Contact Fair  Facial Expression Animated  Affect Appropriate to circumstance  Speech Logical/coherent  Interaction Assertive;Needy  Motor Activity Slow  Appearance/Hygiene Improved  Behavior Characteristics Cooperative  Mood Preoccupied  Aggressive Behavior  Effect No apparent injury  Thought Process  Coherency Circumstantial  Content Preoccupation;Religiosity  Delusions Religious  Perception Derealization  Hallucination None reported or observed  Judgment Poor  Confusion None  Danger to Self  Current suicidal ideation? Denies  Danger to Others  Danger to Others None reported or observed

## 2022-02-08 NOTE — Discharge Instructions (Signed)
-  Follow-up with your outpatient psychiatric provider -instructions on appointment date, time, and address (location) are provided to you in discharge paperwork.  -Take your psychiatric medications as prescribed at discharge - instructions are provided to you in the discharge paperwork  -Follow-up with outpatient primary care doctor and other specialists -for management of preventative medicine and any chronic medical disease.  -Recommend abstinence from alcohol, tobacco, and other illicit drug use at discharge.   -If your psychiatric symptoms recur, worsen, or if you have side effects to your psychiatric medications, call your outpatient psychiatric provider, 911, 988 or go to the nearest emergency department.  -If suicidal thoughts occur or you paranoia worsens, call your outpatient psychiatric provider, 911, 988 or go to the nearest emergency department.

## 2022-02-08 NOTE — Progress Notes (Signed)
   02/08/22 0654  Sleep  Number of Hours 7

## 2022-02-09 ENCOUNTER — Ambulatory Visit (HOSPITAL_COMMUNITY): Payer: Medicaid Other | Admitting: Psychiatry

## 2022-02-12 ENCOUNTER — Ambulatory Visit (HOSPITAL_COMMUNITY)
Admission: RE | Admit: 2022-02-12 | Discharge: 2022-02-12 | Discharge status: Against Medical Advice | Payer: Medicaid Other | Source: Ambulatory Visit

## 2022-02-12 NOTE — ED Notes (Signed)
Per registration, patient left due to having a panic attack.

## 2022-02-14 ENCOUNTER — Emergency Department (HOSPITAL_COMMUNITY)
Admission: EM | Admit: 2022-02-14 | Discharge: 2022-02-14 | Discharge status: Against Medical Advice | Payer: Medicaid Other

## 2022-02-14 ENCOUNTER — Ambulatory Visit (HOSPITAL_COMMUNITY): Payer: Medicaid Other | Admitting: Licensed Clinical Social Worker

## 2022-02-14 NOTE — ED Notes (Addendum)
Pt called to be roomed, no response 

## 2022-02-15 ENCOUNTER — Ambulatory Visit (HOSPITAL_COMMUNITY): Payer: Medicaid Other | Admitting: Psychiatry

## 2022-02-16 ENCOUNTER — Emergency Department (HOSPITAL_COMMUNITY): Payer: Medicaid Other

## 2022-02-16 ENCOUNTER — Encounter: Payer: Self-pay | Admitting: Family Medicine

## 2022-02-16 ENCOUNTER — Other Ambulatory Visit: Payer: Self-pay

## 2022-02-16 ENCOUNTER — Ambulatory Visit (INDEPENDENT_AMBULATORY_CARE_PROVIDER_SITE_OTHER): Payer: Medicaid Other | Admitting: Family Medicine

## 2022-02-16 ENCOUNTER — Emergency Department (HOSPITAL_COMMUNITY)
Admission: EM | Admit: 2022-02-16 | Discharge: 2022-02-17 | Disposition: A | Discharge status: Home / Self Care | Payer: Medicaid Other | Attending: Emergency Medicine | Admitting: Emergency Medicine

## 2022-02-16 ENCOUNTER — Encounter (HOSPITAL_COMMUNITY): Payer: Self-pay

## 2022-02-16 VITALS — BP 132/88 | HR 81 | Temp 97.9°F | Ht 65.0 in | Wt 221.0 lb

## 2022-02-16 DIAGNOSIS — S81012A Laceration without foreign body, left knee, initial encounter: Secondary | ICD-10-CM

## 2022-02-16 DIAGNOSIS — S81012D Laceration without foreign body, left knee, subsequent encounter: Secondary | ICD-10-CM | POA: Insufficient documentation

## 2022-02-16 DIAGNOSIS — Z4802 Encounter for removal of sutures: Secondary | ICD-10-CM

## 2022-02-16 DIAGNOSIS — F200 Paranoid schizophrenia: Secondary | ICD-10-CM

## 2022-02-16 DIAGNOSIS — Z4800 Encounter for change or removal of nonsurgical wound dressing: Secondary | ICD-10-CM | POA: Insufficient documentation

## 2022-02-16 DIAGNOSIS — Z5189 Encounter for other specified aftercare: Secondary | ICD-10-CM

## 2022-02-16 DIAGNOSIS — S8992XD Unspecified injury of left lower leg, subsequent encounter: Secondary | ICD-10-CM | POA: Diagnosis present

## 2022-02-16 LAB — CBC WITH DIFFERENTIAL/PLATELET
Abs Immature Granulocytes: 0.02 10*3/uL (ref 0.00–0.07)
Basophils Absolute: 0 10*3/uL (ref 0.0–0.1)
Basophils Relative: 1 %
Eosinophils Absolute: 0.2 10*3/uL (ref 0.0–0.5)
Eosinophils Relative: 3 %
HCT: 45.8 % (ref 39.0–52.0)
Hemoglobin: 15.6 g/dL (ref 13.0–17.0)
Immature Granulocytes: 0 %
Lymphocytes Relative: 36 %
Lymphs Abs: 2 10*3/uL (ref 0.7–4.0)
MCH: 27.8 pg (ref 26.0–34.0)
MCHC: 34.1 g/dL (ref 30.0–36.0)
MCV: 81.6 fL (ref 80.0–100.0)
Monocytes Absolute: 0.7 10*3/uL (ref 0.1–1.0)
Monocytes Relative: 13 %
Neutro Abs: 2.7 10*3/uL (ref 1.7–7.7)
Neutrophils Relative %: 47 %
Platelets: 276 10*3/uL (ref 150–400)
RBC: 5.61 MIL/uL (ref 4.22–5.81)
RDW: 12.8 % (ref 11.5–15.5)
WBC: 5.7 10*3/uL (ref 4.0–10.5)
nRBC: 0 % (ref 0.0–0.2)

## 2022-02-16 LAB — COMPREHENSIVE METABOLIC PANEL
ALT: 16 U/L (ref 0–44)
AST: 17 U/L (ref 15–41)
Albumin: 4.4 g/dL (ref 3.5–5.0)
Alkaline Phosphatase: 51 U/L (ref 38–126)
Anion gap: 10 (ref 5–15)
BUN: 7 mg/dL (ref 6–20)
CO2: 25 mmol/L (ref 22–32)
Calcium: 9.7 mg/dL (ref 8.9–10.3)
Chloride: 103 mmol/L (ref 98–111)
Creatinine, Ser: 1.14 mg/dL (ref 0.61–1.24)
GFR, Estimated: >60 mL/min (ref >60)
Glucose, Bld: 94 mg/dL (ref 70–99)
Potassium: 3.6 mmol/L (ref 3.5–5.1)
Sodium: 138 mmol/L (ref 135–145)
Total Bilirubin: 0.4 mg/dL (ref 0.3–1.2)
Total Protein: 7.4 g/dL (ref 6.5–8.1)

## 2022-02-16 LAB — LACTIC ACID, PLASMA: Lactic Acid, Venous: 1.4 mmol/L (ref 0.5–1.9)

## 2022-02-16 MED ORDER — HALOPERIDOL 10 MG PO TABS: 10.0000 mg | ORAL_TABLET | Freq: Three times a day (TID) | ORAL | 0 refills | Status: DC

## 2022-02-16 MED ORDER — HALOPERIDOL 10 MG PO TABS: 10.0000 mg | ORAL_TABLET | Freq: Three times a day (TID) | ORAL | 0 refills | Status: AC

## 2022-02-16 MED ORDER — CEPHALEXIN 500 MG PO CAPS: 500.0000 mg | ORAL_CAPSULE | Freq: Four times a day (QID) | ORAL | 0 refills | Status: AC
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
## 2022-02-16 NOTE — Progress Notes (Signed)
New Patient Office Visit  Subjective    Patient ID: Benjamin Werner, male    DOB: [birth year]  Age: 21 y.o. MRN: [id number]  CC:  Chief Complaint  Patient presents with   Establish Care    C/O left knee laceration not healing.    HPI Benjamin Werner presents to establish care Patient's past medical history is significant for newly diagnosed paranoid schizophrenia.  Patient has been noncompliant with his medications.  States he has not been taking it because he feels better.  Also stating cost and prior authorization relating to Haldol.  Patient states he did improve while inpatient for his psychiatric care.  Patient family still he has worsened since then.  Patient is up at night paranoid worried about family members.  He walks around the house at night with a knife or pencil looking for people who may be there to hurt the family.  The mother has seen this states that the patient makes no motion harm himself or others.   Knee laceration has gone without any antibiotics or regular dressing.  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  There is a foul odor and some drainage coming from the wound.  Patient states a staples ripped open initially and then he kept opening the wound.  Patient denies any pain.  Patient denies fevers and chills.  Patient and mother have made 2 attempts to get the wound evaluated but patient has issues with waiting rooms in medical offices and leaves.  Outpatient Encounter Medications as of 02/16/2022  Medication Sig   traZODone (DESYREL) 50 MG tablet Take 1 tablet (50 mg total) by mouth at bedtime as needed for sleep.   benztropine (COGENTIN) 1 MG tablet Take 1 tablet (1 mg total) by mouth every 12 (twelve) hours. (Patient not taking: Reported on 02/16/2022)   haloperidol (HALDOL) 10 MG tablet Take 1 tablet (10 mg total) by mouth every 8 (eight) hours. (Patient not taking: Reported on 02/16/2022)   hydrOXYzine (ATARAX) 25 MG tablet Take 1 tablet (25 mg total)  by mouth 3 (three) times daily as needed for anxiety. (Patient not taking: Reported on 02/16/2022)   OLANZapine (ZYPREXA) 15 MG tablet Take 1 tablet (15 mg total) by mouth at bedtime. (Patient not taking: Reported on 02/16/2022)   No facility-administered encounter medications on file as of 02/16/2022.    Past Medical History:  Diagnosis Date   Anxiety     Past Surgical History:  Procedure Laterality Date   NO PAST SURGERIES      No family history on file.  Social History   Socioeconomic History   Marital status: Single    Spouse name: Not on file   Number of children: Not on file   Years of education: Not on file   Highest education level: Not on file  Occupational History   Not on file  Tobacco Use   Smoking status: Never   Smokeless tobacco: Never  Substance and Sexual Activity   Alcohol use: Not Currently    Comment: occassionally   Drug use: Never    Types: Marijuana   Sexual activity: Not Currently  Other Topics Concern   Not on file  Social History Narrative   Not on file   Social Determinants of Health   Financial Resource Strain: Not on file  Food Insecurity: No Food Insecurity (01/30/2022)   Hunger Vital Sign    Worried About Running Out of Food in the Last Year: Never true  Ran Out of Food in the Last Year: Never true  Transportation Needs: No Transportation Needs (01/30/2022)   PRAPARE - Administrator, Civil Service (Medical): No    Lack of Transportation (Non-Medical): No  Physical Activity: Not on file  Stress: Not on file  Social Connections: Not on file  Intimate Partner Violence: Not At Risk (01/30/2022)   Humiliation, Afraid, Rape, and Kick questionnaire    Fear of Current or Ex-Partner: No    Emotionally Abused: No    Physically Abused: No    Sexually Abused: No    Review of Systems  All other systems reviewed and are negative.       Objective    BP 132/88   Pulse 81   Temp 97.9 F (36.6 C)   Ht 5\' 5"  (1.651  m)   Wt 221 lb (100.2 kg)   SpO2 99%   BMI 36.78 kg/m   Physical Exam Vitals and nursing note reviewed.  Constitutional:      General: He is not in acute distress.    Appearance: Normal appearance. He is normal weight. He is not ill-appearing.  HENT:     Head: Normocephalic and atraumatic.     Mouth/Throat:     Mouth: Mucous membranes are moist.  Eyes:     Extraocular Movements: Extraocular movements intact.     Pupils: Pupils are equal, round, and reactive to light.  Cardiovascular:     Rate and Rhythm: Normal rate and regular rhythm.     Heart sounds: Normal heart sounds.  Pulmonary:     Effort: Pulmonary effort is normal.     Breath sounds: Normal breath sounds.  Abdominal:     General: Abdomen is flat. Bowel sounds are normal. There is no distension.     Palpations: Abdomen is soft.     Tenderness: There is no abdominal tenderness.  Skin:    Capillary Refill: Capillary refill takes less than 2 seconds.  Neurological:     General: No focal deficit present.     Mental Status: He is alert and oriented to person, place, and time.  Psychiatric:        Attention and Perception: Attention and perception normal. He is attentive. He does not perceive auditory or visual hallucinations.        Mood and Affect: Mood is anxious. Mood is not depressed. Affect is blunt and flat. Affect is not labile, angry or inappropriate.        Speech: Speech normal.        Behavior: Behavior is withdrawn.        Thought Content: Thought content is paranoid and delusional. Thought content does not include homicidal or suicidal ideation. Thought content does not include homicidal or suicidal plan.        Cognition and Memory: Cognition and memory normal.        Judgment: Judgment is not impulsive.         Assessment & Plan:   Problem List Items Addressed This Visit       Other   Laceration of left knee - Primary   Relevant Orders   Wound culture   Paranoid schizophrenia (HCC)    Relevant Medications   haloperidol (HALDOL) 10 MG tablet   Infected knee laceration, acute active - This has been present for 4 weeks now, very foul appearing and smelling - After reassurance, urging and multiple conversations patient agrees to go to the emergency room to have this looked at;  we did try contacting a local surgeon to try outpatient management but they also agreed with ED evaluation  Paranoid schizophrenia, active - Patient currently off of his medications but has agreed to restart them, Compliance seems to be the issue with patient's active uncontrolled symptoms, he does not appear to pose a threat to himself or others - Call local pharmacy to try to strychnine patient for outpatient patient, local [facility] able to do medication out-of-pocket for $16, mother patient and patient agreeable to this - Patient had early psychiatric outpatient visits scheduled but missed it, mother patient will call to get this rescheduled and patient will see talk therapist on the 21st - We will see the patient in clinic in 2 weeks to make sure everything is being coordinated  Urged patient and mothet to go to Er for evaluation of infected knee wound. They VU and stated they would go to the ER after leaving the clinic.  Return in about 2 years (around 02/17/2024) for Schizophrenia.   Elwin Mocha, MD
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
## 2022-02-16 NOTE — Discharge Instructions (Addendum)
You were seen today for wound check and staple removal.  Your wound has dehisced.  Make sure to keep it clean and dressed.  Apply bacitracin ointment.  The wound will heal but you will have a large scar.  You will be placed on a short course of antibiotics to cover for any infection.
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
## 2022-02-19 ENCOUNTER — Ambulatory Visit (HOSPITAL_COMMUNITY): Payer: Medicaid Other | Admitting: Licensed Clinical Social Worker

## 2022-02-19 LAB — WOUND CULTURE

## 2022-02-20 ENCOUNTER — Ambulatory Visit (HOSPITAL_COMMUNITY): Payer: Medicaid Other | Admitting: Psychiatry

## 2022-02-20 ENCOUNTER — Ambulatory Visit (HOSPITAL_COMMUNITY): Payer: Medicaid Other | Admitting: Licensed Clinical Social Worker

## 2022-02-20 ENCOUNTER — Encounter (HOSPITAL_COMMUNITY): Payer: Self-pay

## 2022-02-27 ENCOUNTER — Ambulatory Visit (HOSPITAL_COMMUNITY): Payer: Medicaid Other | Admitting: Psychiatry
